# Patient Record
Sex: Male | Born: 1975
Health system: Southern US, Community
[De-identification: ages and names within clinical notes are randomized; demographics above are authoritative.]

## PROBLEM LIST (undated history)

## (undated) DIAGNOSIS — I10 Essential (primary) hypertension: Secondary | ICD-10-CM

## (undated) DIAGNOSIS — E7800 Pure hypercholesterolemia, unspecified: Secondary | ICD-10-CM

## (undated) HISTORY — PX: NO PAST SURGERIES: SHX2092

## (undated) HISTORY — PX: UPPER GASTROINTESTINAL ENDOSCOPY: SHX188

## (undated) HISTORY — DX: Essential (primary) hypertension: I10

## (undated) HISTORY — PX: COLONOSCOPY: SHX174

## (undated) HISTORY — DX: Pure hypercholesterolemia, unspecified: E78.00

---

## 2011-09-11 DIAGNOSIS — E785 Hyperlipidemia, unspecified: Secondary | ICD-10-CM | POA: Insufficient documentation

## 2011-09-11 DIAGNOSIS — J4540 Moderate persistent asthma, uncomplicated: Secondary | ICD-10-CM | POA: Insufficient documentation

## 2011-09-11 DIAGNOSIS — K219 Gastro-esophageal reflux disease without esophagitis: Secondary | ICD-10-CM | POA: Insufficient documentation

## 2011-09-11 DIAGNOSIS — G4700 Insomnia, unspecified: Secondary | ICD-10-CM | POA: Insufficient documentation

## 2011-09-16 DIAGNOSIS — A6000 Herpesviral infection of urogenital system, unspecified: Secondary | ICD-10-CM | POA: Insufficient documentation

## 2011-09-16 DIAGNOSIS — K5900 Constipation, unspecified: Secondary | ICD-10-CM | POA: Insufficient documentation

## 2011-09-16 DIAGNOSIS — G8929 Other chronic pain: Secondary | ICD-10-CM | POA: Insufficient documentation

## 2011-09-16 DIAGNOSIS — M545 Low back pain, unspecified: Secondary | ICD-10-CM | POA: Insufficient documentation

## 2011-09-16 DIAGNOSIS — L649 Androgenic alopecia, unspecified: Secondary | ICD-10-CM | POA: Insufficient documentation

## 2012-12-23 DIAGNOSIS — I10 Essential (primary) hypertension: Secondary | ICD-10-CM | POA: Insufficient documentation

## 2013-07-17 DIAGNOSIS — R059 Cough, unspecified: Secondary | ICD-10-CM | POA: Insufficient documentation

## 2013-07-17 DIAGNOSIS — Z789 Other specified health status: Secondary | ICD-10-CM | POA: Insufficient documentation

## 2013-07-17 DIAGNOSIS — R05 Cough: Secondary | ICD-10-CM | POA: Insufficient documentation

## 2013-07-25 DIAGNOSIS — J45909 Unspecified asthma, uncomplicated: Secondary | ICD-10-CM | POA: Insufficient documentation

## 2014-09-23 DIAGNOSIS — E669 Obesity, unspecified: Secondary | ICD-10-CM | POA: Insufficient documentation

## 2014-09-23 DIAGNOSIS — R600 Localized edema: Secondary | ICD-10-CM | POA: Insufficient documentation

## 2014-09-23 DIAGNOSIS — R635 Abnormal weight gain: Secondary | ICD-10-CM | POA: Insufficient documentation

## 2014-09-23 DIAGNOSIS — E6609 Other obesity due to excess calories: Secondary | ICD-10-CM | POA: Insufficient documentation

## 2014-09-28 DIAGNOSIS — R000 Tachycardia, unspecified: Secondary | ICD-10-CM | POA: Insufficient documentation

## 2014-09-28 DIAGNOSIS — R072 Precordial pain: Secondary | ICD-10-CM | POA: Insufficient documentation

## 2016-02-07 DIAGNOSIS — H6120 Impacted cerumen, unspecified ear: Secondary | ICD-10-CM

## 2016-02-07 HISTORY — DX: Impacted cerumen, unspecified ear: H61.20

## 2017-08-27 LAB — HEPATIC FUNCTION PANEL
ALT: 59 — AB (ref 10–40)
AST: 23 (ref 14–40)
Alkaline Phosphatase: 88 (ref 25–125)

## 2017-08-27 LAB — BASIC METABOLIC PANEL
BUN: 15 (ref 4–21)
Creatinine: 1 (ref <=1.3)
Glucose: 111
Potassium: 4.2 (ref 3.4–5.3)
Sodium: 132 — AB (ref 137–147)

## 2017-08-27 LAB — TSH: TSH: 0.86 (ref <=5.90)

## 2017-08-27 LAB — LIPID PANEL
Cholesterol: 157 (ref 0–200)
HDL: 38 (ref 35–70)
LDL Cholesterol: 107
Triglycerides: 120 (ref 40–160)

## 2017-08-27 LAB — CBC AND DIFFERENTIAL
HCT: 43 (ref 41–53)
Hemoglobin: 14.2 (ref 13.5–17.5)
Platelets: 269 (ref 150–399)
WBC: 13.6

## 2017-08-29 DIAGNOSIS — R7989 Other specified abnormal findings of blood chemistry: Secondary | ICD-10-CM | POA: Insufficient documentation

## 2017-08-29 DIAGNOSIS — R945 Abnormal results of liver function studies: Secondary | ICD-10-CM | POA: Insufficient documentation

## 2017-08-29 DIAGNOSIS — R7303 Prediabetes: Secondary | ICD-10-CM | POA: Insufficient documentation

## 2017-08-29 DIAGNOSIS — E291 Testicular hypofunction: Secondary | ICD-10-CM | POA: Insufficient documentation

## 2018-01-29 LAB — HEMOGLOBIN A1C: Hemoglobin A1C: 5.7

## 2018-01-29 LAB — LIPID PANEL
Cholesterol: 139 (ref 0–200)
HDL: 38 (ref 35–70)
LDL Cholesterol: 102
Triglycerides: 99 (ref 40–160)

## 2018-01-29 LAB — BASIC METABOLIC PANEL
BUN: 14 (ref 4–21)
Creatinine: 0.9 (ref <=1.3)
Glucose: 75
Potassium: 4.6 (ref 3.4–5.3)
Sodium: 135 — AB (ref 137–147)

## 2018-01-29 LAB — HEPATIC FUNCTION PANEL
ALT: 32 (ref 10–40)
AST: 20 (ref 14–40)
Alkaline Phosphatase: 72 (ref 25–125)

## 2018-01-29 LAB — CBC AND DIFFERENTIAL
HCT: 49 (ref 41–53)
Hemoglobin: 16.1 (ref 13.5–17.5)
Platelets: 335 (ref 150–399)
WBC: 12

## 2018-01-29 LAB — TSH: TSH: 1.11 (ref <=5.90)

## 2018-09-11 DIAGNOSIS — R1319 Other dysphagia: Secondary | ICD-10-CM | POA: Insufficient documentation

## 2018-09-11 DIAGNOSIS — K2100 Gastro-esophageal reflux disease with esophagitis, without bleeding: Secondary | ICD-10-CM | POA: Insufficient documentation

## 2019-07-20 ENCOUNTER — Ambulatory Visit (INDEPENDENT_AMBULATORY_CARE_PROVIDER_SITE_OTHER): Payer: No Typology Code available for payment source | Admitting: Family Medicine

## 2019-07-20 ENCOUNTER — Other Ambulatory Visit: Payer: Self-pay

## 2019-07-20 ENCOUNTER — Encounter: Payer: Self-pay | Admitting: Family Medicine

## 2019-07-20 VITALS — BP 125/90 | HR 98 | Temp 98.6°F | Ht 67.0 in | Wt 201.0 lb

## 2019-07-20 DIAGNOSIS — F419 Anxiety disorder, unspecified: Secondary | ICD-10-CM

## 2019-07-20 DIAGNOSIS — E782 Mixed hyperlipidemia: Secondary | ICD-10-CM

## 2019-07-20 DIAGNOSIS — H43393 Other vitreous opacities, bilateral: Secondary | ICD-10-CM

## 2019-07-20 DIAGNOSIS — F5101 Primary insomnia: Secondary | ICD-10-CM

## 2019-07-20 DIAGNOSIS — I10 Essential (primary) hypertension: Secondary | ICD-10-CM

## 2019-07-20 DIAGNOSIS — J4540 Moderate persistent asthma, uncomplicated: Secondary | ICD-10-CM | POA: Diagnosis not present

## 2019-07-20 DIAGNOSIS — R7303 Prediabetes: Secondary | ICD-10-CM

## 2019-07-20 DIAGNOSIS — K2100 Gastro-esophageal reflux disease with esophagitis, without bleeding: Secondary | ICD-10-CM

## 2019-07-20 DIAGNOSIS — F988 Other specified behavioral and emotional disorders with onset usually occurring in childhood and adolescence: Secondary | ICD-10-CM

## 2019-07-20 DIAGNOSIS — E291 Testicular hypofunction: Secondary | ICD-10-CM

## 2019-07-20 MED ORDER — MONTELUKAST SODIUM 10 MG PO TABS: 10.0000 mg | ORAL_TABLET | Freq: Every day | ORAL | 3 refills | Status: DC

## 2019-07-20 MED ORDER — LANSOPRAZOLE 30 MG PO CPDR: 30.0000 mg | DELAYED_RELEASE_CAPSULE | Freq: Every day | ORAL | 3 refills | Status: DC

## 2019-07-20 MED ORDER — TRAZODONE HCL 50 MG PO TABS: ORAL_TABLET | ORAL | 3 refills | Status: DC

## 2019-07-20 MED ORDER — ZOLPIDEM TARTRATE 10 MG PO TABS: ORAL_TABLET | ORAL | 3 refills | Status: DC

## 2019-07-20 NOTE — Progress Notes (Signed)
Patient: Troy Burgess MRN: [id number] DOB: 1976/08/11 PCP: Orma Flaming, MD     Subjective:  Chief Complaint  Patient presents with  . Establish Care    HPI: The patient is a 43 y.o. male who presents today for establishing care. He has multiple issues. Just had lab work done in [facility].   Hypertension: Here for follow up of hypertension.  Currently on cozaar 50mg /day .  Takes medication as prescribed and denies any side effects. Exercise includes walking. Weight has been stable. Denies any chest pain, headaches, shortness of breath, vision changes, swelling in lower extremities.   Hypogonadism: followed by urology, has had complete work up done. On 100mg  testosterone/week and doing well. Would like to see if we can take this over.   Hyperlipidemia: well controlled on medication. Thinks he just had his labs done.   Prediabetes: has lost 30 pounds. Unsure what his lab work showed recently.   ADD: [facility] at [facility]. Needs referral with new insurance.   Anxiety: on low dose lexapro. Mainly needs for public speaking in big crowds.   GERD: prevacid. Requesting prescription.   Insomnia: ambien and trazodone. Requesting refills. Has been on >10 years.   Having increased floaters in both eyes. Would like referral to eye doctor.    Flu shot: had last month Tdap: utd, need shot records. hiv screen: will get records and if need will add at next visit.   Review of Systems  Constitutional: Negative for fever.  Eyes: Positive for visual disturbance.  Respiratory: Negative for shortness of breath.   Cardiovascular: Negative for chest pain, palpitations and leg swelling.  Gastrointestinal: Negative for abdominal pain, diarrhea, nausea and vomiting.  Endocrine: Negative for polydipsia and polyuria.  Skin: Negative for rash.  Neurological: Negative for dizziness and headaches.  Psychiatric/Behavioral: Negative for sleep disturbance.     Allergies Patient is allergic to amlodipine; bupropion; ciprofloxacin; codeine; hydrocodone-acetaminophen; lisinopril; and nortriptyline.  Past Medical History Patient  has no past medical history on file.  Surgical History Patient  has no past surgical history on file.  Family History Pateint's family history is not on file.  Social History Patient  reports that he has quit smoking. He has never used smokeless tobacco.    Objective: Vitals:   07/20/19 1050  BP: 125/90  Pulse: 98  Temp: 98.6 F (37 C)  TempSrc: Skin  SpO2: 95%  Weight: 201 lb (91.2 kg)  Height: 5\' 7"  (1.702 m)    Body mass index is 31.48 kg/m.  Physical Exam Vitals signs reviewed.  Constitutional:      Appearance: Normal appearance. He is well-developed.  HENT:     Head: Normocephalic and atraumatic.     Right Ear: Tympanic membrane, ear canal and external ear normal.     Left Ear: Tympanic membrane, ear canal and external ear normal.     Nose: Nose normal.     Mouth/Throat:     Mouth: Mucous membranes are moist.  Eyes:     Extraocular Movements: Extraocular movements intact.     Conjunctiva/sclera: Conjunctivae normal.     Pupils: Pupils are equal, round, and reactive to light.  Neck:     Musculoskeletal: Normal range of motion and neck supple.     Thyroid: No thyromegaly.     Vascular: No carotid bruit.  Cardiovascular:     Rate and Rhythm: Normal rate and regular rhythm.     Heart sounds: Normal heart sounds. No murmur.  Pulmonary:  Effort: Pulmonary effort is normal.     Breath sounds: Normal breath sounds.  Abdominal:     General: Bowel sounds are normal. There is no distension.     Palpations: Abdomen is soft.     Tenderness: There is no abdominal tenderness.  Lymphadenopathy:     Cervical: No cervical adenopathy.  Skin:    General: Skin is warm and dry.     Findings: No rash.  Neurological:     General: No focal deficit present.     Mental Status: He is alert and  oriented to person, place, and time.     Cranial Nerves: No cranial nerve deficit.     Coordination: Coordination normal.     Deep Tendon Reflexes: Reflexes normal.  Psychiatric:        Behavior: Behavior normal.        Depression screen PHQ 2/9 07/20/2019  Decreased Interest 0  Down, Depressed, Hopeless 0  PHQ - 2 Score 0  Altered sleeping 0  Tired, decreased energy 0  Change in appetite 0  Feeling bad or failure about yourself  0  Trouble concentrating 0  Moving slowly or fidgety/restless 0  Suicidal thoughts 0  PHQ-9 Score 0  Difficult doing work/chores Not difficult at all     Assessment/plan: 1. Hypertension, essential Close to goal. Will not adjust at this time. Requesting records for labs/past hx. No refills needed at this time.   2. Mixed hyperlipidemia Requesting records. No refills needed.   3. Pre-diabetes Has lost 30 pounds. Requesting records before I do any labs. On no medication.   4. Moderate persistent asthma without complication singulair refilled.   5. Hypogonadism, male Will take over filling testosterone for him. No refills needed at this time. Routine labs will be needed for monitoring. Requesting records.   6. Gastroesophageal reflux disease with esophagitis without hemorrhage Sent in prevacid.   7. Primary insomnia Has been on ambien x 10 years and trazodone. Both need refilled. pmp reviewed. Will need drug contract at next visit.   8. Attention deficit disorder (ADD) without hyperactivity Already being seen by Dr. Rachel Moulds. Referral done for insurance purposes.  - Ambulatory referral to Internal Medicine  9. Floaters in visual field, bilateral  - Ambulatory referral to Ophthalmology  10. Anxiety Well controlled on 5mg  of lexapro. No refills needed at this time.     Return in about 6 months (around 01/18/2020) for fasting labs. get your records..  >40 minutes spent in face to face time. Requesting records to review. Will bring  him back earlier if needed for labs.   Orma Flaming, MD [place]    07/20/2019

## 2019-09-23 ENCOUNTER — Telehealth: Payer: Self-pay | Admitting: Family Medicine

## 2019-09-23 NOTE — Telephone Encounter (Signed)
I emailed charge corrections on 08/12/2019 to resubmit the claim for: Date of Service:07/20/19 Insurance Name: [employer] [id number]   Billing stated on 08/12/2019 that this was in a charge review due to front office adding the coverage. Billing resubmitted it out of the charge review. No further action required. No need to route note, for documentation purposes only.

## 2019-10-17 ENCOUNTER — Other Ambulatory Visit: Payer: Self-pay

## 2019-10-17 DIAGNOSIS — Z20822 Contact with and (suspected) exposure to covid-19: Secondary | ICD-10-CM

## 2019-10-18 LAB — NOVEL CORONAVIRUS, NAA: SARS-CoV-2, NAA: DETECTED — AB

## 2019-10-20 ENCOUNTER — Telehealth: Payer: Self-pay

## 2019-10-20 NOTE — Telephone Encounter (Signed)
Patient test positive for covid on Sunday. Patient would like to know if he qualify for covid infusion and would like some additional infro regarding that. Best contact number [phone].

## 2019-10-20 NOTE — Telephone Encounter (Signed)
Please see message. °

## 2019-10-21 ENCOUNTER — Other Ambulatory Visit: Payer: Self-pay | Admitting: Family Medicine

## 2019-10-21 ENCOUNTER — Telehealth: Payer: Self-pay | Admitting: Family Medicine

## 2019-10-21 MED ORDER — ALBUTEROL SULFATE HFA 108 (90 BASE) MCG/ACT IN AERS: 2.0000 | INHALATION_SPRAY | Freq: Four times a day (QID) | RESPIRATORY_TRACT | 3 refills | Status: DC | PRN

## 2019-10-21 MED ORDER — FLUTICASONE PROPIONATE HFA 220 MCG/ACT IN AERO: 1.0000 | INHALATION_SPRAY | Freq: Two times a day (BID) | RESPIRATORY_TRACT | 1 refills | Status: DC

## 2019-10-21 NOTE — Telephone Encounter (Signed)
  LAST APPOINTMENT DATE: @@LASTENCT @  NEXT APPOINTMENT DATE:@Visit  date not found  MEDICATION: albuterol (VENTOLIN HFA) 108 (90 Base) MCG/ACT inhaler/escitalopram (LEXAPRO) 10 MG tablet/fluticasone (FLOVENT HFA) 220 MCG/ACT inhaler  PHARMACY: [facility] at [place], [place] [address].  COMMENTS: Doren Custard asked for authorization because they have never refilled this for the patient   **Let patient know to contact pharmacy at the end of the day to make sure medication is ready. **  ** Please notify patient to allow 48-72 hours to process**  **Encourage patient to contact the pharmacy for refills or they can request refills through [facility]**  CLINICAL FILLS OUT ALL BELOW:   LAST REFILL:  QTY:  REFILL DATE:    OTHER COMMENTS:    Okay for refill?  Please advise

## 2019-10-21 NOTE — Telephone Encounter (Signed)
Left message to return call to our office.  

## 2019-10-21 NOTE — Telephone Encounter (Signed)
Refilled.  Mariah Gerstenberger, MD [place] [facility] [place] [facility]   

## 2019-10-21 NOTE — Telephone Encounter (Signed)
Patient called back to speak to [facility].

## 2019-10-21 NOTE — Telephone Encounter (Signed)
Let him know that he may not meet criteria. He's young and has asthma. Im sending to cheryl wicker to see if he would quality and they should get into touch with him.  Cheryl-let me know if I need to do anything different.  Thanks so much,  Dr Rogers Blocker

## 2019-10-22 NOTE — Telephone Encounter (Signed)
Left message to return call to our office.  

## 2019-10-22 NOTE — Telephone Encounter (Signed)
Dr. Rogers Blocker, pt is needing refill on Lexapro 10 mg, you have not filled this for him. Okay to fill?

## 2019-10-22 NOTE — Telephone Encounter (Signed)
Called patient gave information. He states that any time in the past he has been sick they would give neb treatment. He wanted to know if you would give script for nebulizer and medications so that he can do at home if he needs.

## 2019-10-22 NOTE — Telephone Encounter (Signed)
For covid, not recommending nebulizer treatment due to aerolized spread. I know in own home, but inhaler is recommended. I just filled this.  Orma Flaming, MD [place]

## 2019-10-22 NOTE — Telephone Encounter (Signed)
Spoke to pt told him per Dr. Rogers Blocker, due to [place], not recommending nebulizer treatment due to aerolized spread. I know in own home, but inhaler is recommended. Pt verbalized understanding and asked if both were filled and Lexapro. Told pt both inhalers were filled. I will send message about Lexapro to have filled since she has not prescribed before. Pt verbalized understanding.

## 2019-10-23 ENCOUNTER — Other Ambulatory Visit: Payer: Self-pay | Admitting: Family Medicine

## 2019-10-23 MED ORDER — ESCITALOPRAM OXALATE 10 MG PO TABS: 10.0000 mg | ORAL_TABLET | Freq: Every day | ORAL | 0 refills | Status: DC

## 2019-10-23 NOTE — Telephone Encounter (Signed)
Escribed

## 2019-10-23 NOTE — Addendum Note (Signed)
Addended by: Jerrel Ivory D on: 10/23/2019 02:16 PM   Modules accepted: Orders

## 2019-10-23 NOTE — Telephone Encounter (Signed)
MEDICATION:escitalopram (LEXAPRO) 10 MG tablet  PHARMACY: [facility] at [place], [place] Phone:  [phone]  Fax:  [phone]       Comments:   **Let patient know to contact pharmacy at the end of the day to make sure medication is ready. **  ** Please notify patient to allow 48-72 hours to process**  **Encourage patient to contact the pharmacy for refills or they can request refills through [facility]**

## 2019-10-25 NOTE — Telephone Encounter (Signed)
Okay to fill. Already filled by CMA.  Orma Flaming, MD [place]

## 2019-11-04 ENCOUNTER — Ambulatory Visit (INDEPENDENT_AMBULATORY_CARE_PROVIDER_SITE_OTHER): Payer: No Typology Code available for payment source | Admitting: Family Medicine

## 2019-11-04 ENCOUNTER — Encounter: Payer: Self-pay | Admitting: Family Medicine

## 2019-11-04 VITALS — Ht 67.0 in | Wt 205.0 lb

## 2019-11-04 DIAGNOSIS — E291 Testicular hypofunction: Secondary | ICD-10-CM | POA: Diagnosis not present

## 2019-11-04 DIAGNOSIS — J45901 Unspecified asthma with (acute) exacerbation: Secondary | ICD-10-CM | POA: Diagnosis not present

## 2019-11-04 DIAGNOSIS — U071 COVID-19: Secondary | ICD-10-CM | POA: Diagnosis not present

## 2019-11-04 MED ORDER — PREDNISONE 20 MG PO TABS: 40.0000 mg | ORAL_TABLET | Freq: Every day | ORAL | 0 refills | Status: DC

## 2019-11-04 MED ORDER — FLUTICASONE PROPIONATE 50 MCG/ACT NA SUSP: 2.0000 | Freq: Every day | NASAL | 6 refills | Status: DC

## 2019-11-04 NOTE — Progress Notes (Signed)
Patient: Troy Burgess MRN: [id number] DOB: Jul 19, 1976 PCP: Orma Flaming, MD     I connected with Troy Burgess on 11/04/19 at 4:22pm by a video enabled telemedicine application and verified that I am speaking with the correct person using two identifiers.  Location patient: Home Location provider: [facility], Office Persons participating in this virtual visit: Troy Burgess and Dr. Rogers Blocker  I discussed the limitations of evaluation and management by telemedicine and the availability of in person appointments. The patient expressed understanding and agreed to proceed.   Subjective:  Chief Complaint  Patient presents with  . Medication Problem    Testosterone; He would like you to take over medication maintenance due to insurance.   . Cough    Post Covid  . Sore Throat    With phlem new after Covid    HPI: The patient is a 44 y.o. male who presents today for post covid symptoms. He tested positive on 10/17/2019 and continues to have a cough and sore throat. He has not had any fever, chills or shortness of breath. He is now starting to cough up phelgm and wonders if he has something new. He has not taken any medication over the counter. He is not gurgling with warm salt water or using humidifier. He has some mild asthma and has done well with his flovent. He has only used his rescue inhaler once. He has some tightness in his upper lungs, but feels better after using his inhaler. His cough is mainly dry, but over the past 3 days started to have some production.   He also would like me to take over his testosterone injections. His clinic that he was going to is not really seeing patients. He also gives blood every 60 days. He has not had his labs checked in a very long time. He also had a scan of his pituitary gland and that was normal.  Currently on testosterone 200mg  q 14 days. His wife gives him his injections.   Review of Systems  Constitutional: Negative for chills, fatigue and  fever.  HENT: Positive for sore throat. Negative for congestion, dental problem, ear pain, hearing loss and trouble swallowing.   Eyes: Negative for visual disturbance.  Respiratory: Positive for cough, chest tightness and shortness of breath. Negative for wheezing.   Cardiovascular: Negative for chest pain, palpitations and leg swelling.  Gastrointestinal: Negative for abdominal pain, blood in stool, diarrhea and nausea.  Endocrine: Negative for cold intolerance, polydipsia, polyphagia and polyuria.  Genitourinary: Negative for dysuria and hematuria.  Musculoskeletal: Negative for arthralgias.  Skin: Negative for rash.  Neurological: Negative for dizziness and headaches.  Psychiatric/Behavioral: Negative for dysphoric mood and sleep disturbance. The patient is not nervous/anxious.     Allergies Patient is allergic to amlodipine; bupropion; ciprofloxacin; codeine; hydrocodone-acetaminophen; lisinopril; and nortriptyline.  Past Medical History Patient  has no past medical history on file.  Surgical History Patient  has no past surgical history on file.  Family History Pateint's family history is not on file.  Social History Patient  reports that he has quit smoking. He has never used smokeless tobacco.    Objective: Vitals:   11/04/19 1355  Weight: 205 lb (93 kg)  Height: 5\' 7"  (1.702 m)    Body mass index is 32.11 kg/m.  Physical Exam Vitals reviewed.  Constitutional:      Appearance: He is well-developed.  HENT:     Head: Normocephalic and atraumatic.  Pulmonary:     Effort: Pulmonary effort is normal.  Neurological:     General: No focal deficit present.     Mental Status: He is alert and oriented to person, place, and time.  Psychiatric:        Mood and Affect: Mood normal.        Behavior: Behavior normal.        Assessment/plan: 1. Clinical diagnosis of COVID-19 Continues to have lingering cough. ? If asthma contributing vs. Bronchitis. Conservative  therapy at this time as no clinical suspicion for bacterial source off history and exam via video. Recommended otc cough medication, cool mist humidifier, warm salt water gurgles, honey and use of his albuterol inhaler scheduled for 2-3 days. If not getting better can come in for xray. Precautions given.   2. Mild asthma exacerbation Steroid burst x 5 days if needed and scheduled albuterol x 2-3 days.   3. Hypogonadism, male  - Ambulatory referral to Urology    Return if symptoms worsen or fail to improve.  Records requested if needed. Time spent with patient: 20 minutes, of which >50% was spent in obtaining information about his symptoms, reviweing his previous labs, evaluations, and treatments, counseling him about his conditions (please see discussed topics above), and developing a plan to further investigate it; he had a number of questions which I addressed.    Orma Flaming, MD [place]  11/04/2019

## 2019-11-04 NOTE — Progress Notes (Deleted)
Patient: Troy Burgess MRN: [id number] DOB: 1975-12-17 PCP: Orma Flaming, MD     I connected with Briscoe Deutscher on 11/04/19 at @CHLAPPTIME @ by a video enabled telemedicine application and verified that I am speaking with the correct person using two identifiers.  Location patient: Home Location provider: [facility], Office Persons participating in this virtual visit: ***  I discussed the limitations of evaluation and management by telemedicine and the availability of in person appointments. The patient expressed understanding and agreed to proceed.   Subjective:  No chief complaint on file.   HPI: The patient is a 44 y.o. male who presents today for ***  Review of Systems  Allergies Patient is allergic to amlodipine; bupropion; ciprofloxacin; codeine; hydrocodone-acetaminophen; lisinopril; and nortriptyline.  Past Medical History Patient  has no past medical history on file.  Surgical History Patient  has no past surgical history on file.  Family History Pateint's family history is not on file.  Social History Patient  reports that he has quit smoking. He has never used smokeless tobacco.    Objective: There were no vitals filed for this visit.  There is no height or weight on file to calculate BMI.  Physical Exam     Assessment/plan:      No follow-ups on file.  Records requested if needed. Time spent with patient: *** minutes, of which >50% was spent in obtaining information about his symptoms, reviweing his previous labs, evaluations, and treatments, counseling him about his conditions (please see discussed topics above), and developing a plan to further investigate it; he had a number of questions which I addressed.    Orma Flaming, MD [place]  11/04/2019

## 2019-12-24 ENCOUNTER — Other Ambulatory Visit: Payer: Self-pay | Admitting: Family Medicine

## 2020-01-08 ENCOUNTER — Other Ambulatory Visit: Payer: Self-pay | Admitting: Family Medicine

## 2020-01-08 ENCOUNTER — Telehealth: Payer: Self-pay | Admitting: Family Medicine

## 2020-01-08 MED ORDER — LOSARTAN POTASSIUM 50 MG PO TABS: 50.0000 mg | ORAL_TABLET | Freq: Every day | ORAL | 1 refills | Status: DC

## 2020-01-08 MED ORDER — ATORVASTATIN CALCIUM 40 MG PO TABS: 40.0000 mg | ORAL_TABLET | Freq: Every day | ORAL | 3 refills | Status: DC

## 2020-01-08 NOTE — Telephone Encounter (Signed)
  LAST APPOINTMENT DATE: 11/04/19  NEXT APPOINTMENT DATE:@Visit  date not found  MEDICATION:atorvastatin (LIPITOR) 40 MG tablet/VALSARTAN 160 MG  PHARMACY:[facility] at [place], [place] [address]  **Let patient know to contact pharmacy at the end of the day to make sure medication is ready. **  ** Please notify patient to allow 48-72 hours to process**  **Encourage patient to contact the pharmacy for refills or they can request refills through [facility]**  CLINICAL FILLS OUT ALL BELOW:   LAST REFILL:  QTY:  REFILL DATE:    OTHER COMMENTS:    Okay for refill?  Please advise

## 2020-01-08 NOTE — Telephone Encounter (Signed)
Refilled.  Shayley Medlin, MD [place]   

## 2020-02-09 ENCOUNTER — Other Ambulatory Visit: Payer: Self-pay | Admitting: Family Medicine

## 2020-02-09 NOTE — Telephone Encounter (Signed)
LAST APPOINTMENT DATE: 11/04/2019  NEXT APPOINTMENT DATE: Visit date not found  RX Ambien   LAST REFILL: 07/19/2019 QTY: 90 3RF

## 2020-03-25 ENCOUNTER — Other Ambulatory Visit: Payer: Self-pay | Admitting: Family Medicine

## 2020-03-25 MED ORDER — DOXYCYCLINE HYCLATE 100 MG PO TABS: ORAL_TABLET | ORAL | 0 refills | Status: DC

## 2020-05-18 ENCOUNTER — Other Ambulatory Visit: Payer: Self-pay | Admitting: Family Medicine

## 2020-06-22 ENCOUNTER — Other Ambulatory Visit: Payer: Self-pay | Admitting: Family Medicine

## 2020-07-12 ENCOUNTER — Other Ambulatory Visit (HOSPITAL_COMMUNITY): Payer: Self-pay | Admitting: Urology

## 2020-07-21 ENCOUNTER — Other Ambulatory Visit (HOSPITAL_COMMUNITY): Payer: Self-pay | Admitting: Internal Medicine

## 2020-07-21 ENCOUNTER — Other Ambulatory Visit: Payer: Self-pay | Admitting: Family Medicine

## 2020-07-22 ENCOUNTER — Encounter: Payer: Self-pay | Admitting: Family Medicine

## 2020-07-22 ENCOUNTER — Ambulatory Visit (INDEPENDENT_AMBULATORY_CARE_PROVIDER_SITE_OTHER): Payer: No Typology Code available for payment source | Admitting: Family Medicine

## 2020-07-22 ENCOUNTER — Other Ambulatory Visit: Payer: Self-pay | Admitting: Family Medicine

## 2020-07-22 ENCOUNTER — Other Ambulatory Visit: Payer: Self-pay

## 2020-07-22 VITALS — BP 128/90 | HR 97 | Temp 97.6°F | Ht 67.0 in | Wt 208.0 lb

## 2020-07-22 DIAGNOSIS — I10 Essential (primary) hypertension: Secondary | ICD-10-CM | POA: Diagnosis not present

## 2020-07-22 DIAGNOSIS — R7303 Prediabetes: Secondary | ICD-10-CM | POA: Diagnosis not present

## 2020-07-22 DIAGNOSIS — Z1159 Encounter for screening for other viral diseases: Secondary | ICD-10-CM

## 2020-07-22 DIAGNOSIS — E782 Mixed hyperlipidemia: Secondary | ICD-10-CM

## 2020-07-22 DIAGNOSIS — F419 Anxiety disorder, unspecified: Secondary | ICD-10-CM

## 2020-07-22 DIAGNOSIS — Z0000 Encounter for general adult medical examination without abnormal findings: Secondary | ICD-10-CM

## 2020-07-22 DIAGNOSIS — E291 Testicular hypofunction: Secondary | ICD-10-CM

## 2020-07-22 DIAGNOSIS — Z114 Encounter for screening for human immunodeficiency virus [HIV]: Secondary | ICD-10-CM

## 2020-07-22 DIAGNOSIS — Z1211 Encounter for screening for malignant neoplasm of colon: Secondary | ICD-10-CM

## 2020-07-22 NOTE — Patient Instructions (Signed)
-find out when you had your tdap (tetanus) -flu shot whenever you can do it.  -referral for cscope (colon cancer screening) -all of your labs today including testosterone (best time to check is in the AM) and prostate  Blood pressure high, you haven't been on meds though.  Really work on exercise/weight loss.   So good to see you! Dr. Rogers Blocker   Preventive Care 61-46 Years Old, Male Preventive care refers to lifestyle choices and visits with your health care provider that can promote health and wellness. This includes:  A yearly physical exam. This is also called an annual well check.  Regular dental and eye exams.  Immunizations.  Screening for certain conditions.  Healthy lifestyle choices, such as eating a healthy diet, getting regular exercise, not using drugs or products that contain nicotine and tobacco, and limiting alcohol use. What can I expect for my preventive care visit? Physical exam Your health care provider will check:  Height and weight. These may be used to calculate body mass index (BMI), which is a measurement that tells if you are at a healthy weight.  Heart rate and blood pressure.  Your skin for abnormal spots. Counseling Your health care provider may ask you questions about:  Alcohol, tobacco, and drug use.  Emotional well-being.  Home and relationship well-being.  Sexual activity.  Eating habits.  Work and work Statistician. What immunizations do I need?  Influenza (flu) vaccine  This is recommended every year. Tetanus, diphtheria, and pertussis (Tdap) vaccine  You may need a Td booster every 10 years. Varicella (chickenpox) vaccine  You may need this vaccine if you have not already been vaccinated. Zoster (shingles) vaccine  You may need this after age 40. Measles, mumps, and rubella (MMR) vaccine  You may need at least one dose of MMR if you were born in 1957 or later. You may also need a second dose. Pneumococcal conjugate (PCV13)  vaccine  You may need this if you have certain conditions and were not previously vaccinated. Pneumococcal polysaccharide (PPSV23) vaccine  You may need one or two doses if you smoke cigarettes or if you have certain conditions. Meningococcal conjugate (MenACWY) vaccine  You may need this if you have certain conditions. Hepatitis A vaccine  You may need this if you have certain conditions or if you travel or work in places where you may be exposed to hepatitis A. Hepatitis B vaccine  You may need this if you have certain conditions or if you travel or work in places where you may be exposed to hepatitis B. Haemophilus influenzae type b (Hib) vaccine  You may need this if you have certain risk factors. Human papillomavirus (HPV) vaccine  If recommended by your health care provider, you may need three doses over 6 months. You may receive vaccines as individual doses or as more than one vaccine together in one shot (combination vaccines). Talk with your health care provider about the risks and benefits of combination vaccines. What tests do I need? Blood tests  Lipid and cholesterol levels. These may be checked every 5 years, or more frequently if you are over 78 years old.  Hepatitis C test.  Hepatitis B test. Screening  Lung cancer screening. You may have this screening every year starting at age 79 if you have a 30-pack-year history of smoking and currently smoke or have quit within the past 15 years.  Prostate cancer screening. Recommendations will vary depending on your family history and other risks.  Colorectal cancer  screening. All adults should have this screening starting at age 50 and continuing until age 75. Your health care provider may recommend screening at age 45 if you are at increased risk. You will have tests every 1-10 years, depending on your results and the type of screening test.  Diabetes screening. This is done by checking your blood sugar (glucose) after  you have not eaten for a while (fasting). You may have this done every 1-3 years.  Sexually transmitted disease (STD) testing. Follow these instructions at home: Eating and drinking  Eat a diet that includes fresh fruits and vegetables, whole grains, lean protein, and low-fat dairy products.  Take vitamin and mineral supplements as recommended by your health care provider.  Do not drink alcohol if your health care provider tells you not to drink.  If you drink alcohol: ? Limit how much you have to 0-2 drinks a day. ? Be aware of how much alcohol is in your drink. In the [place] one drink equals one 12 oz bottle of beer (355 mL), one 5 oz glass of wine (148 mL), or one 1 oz glass of hard liquor (44 mL). Lifestyle  Take daily care of your teeth and gums.  Stay active. Exercise for at least 30 minutes on 5 or more days each week.  Do not use any products that contain nicotine or tobacco, such as cigarettes, e-cigarettes, and chewing tobacco. If you need help quitting, ask your health care provider.  If you are sexually active, practice safe sex. Use a condom or other form of protection to prevent STIs (sexually transmitted infections).  Talk with your health care provider about taking a low-dose aspirin every day starting at age 50. What's next?  Go to your health care provider once a year for a well check visit.  Ask your health care provider how often you should have your eyes and teeth checked.  Stay up to date on all vaccines. This information is not intended to replace advice given to you by your health care provider. Make sure you discuss any questions you have with your health care provider. Document Revised: 09/11/2018 Document Reviewed: 09/11/2018 Elsevier Patient Education  2020 [employer] .      

## 2020-07-22 NOTE — Progress Notes (Signed)
Patient: Troy Burgess MRN: [id number] DOB: 02-Jan-1976 PCP: Orma Flaming, MD     Subjective:  Chief Complaint  Patient presents with   Annual Exam   Hypertension   Hyperlipidemia   Prediabetes   Anxiety    HPI: The patient is a 44 y.o. male who presents today for annual exam and follow up of chronic conditions. He denies any changes to past medical history. There have been no recent hospitalizations. They are not following a well balanced diet and exercise plan. Weight has been stable.   Hypertension: Here for follow up of hypertension.  Currently on cozaar 50mg /day. Takes medication as prescribed and denies any side effects. Has not taken his medication in 4 days.  Exercise includes none.  Weight has been stable. Denies any chest pain, headaches, shortness of breath, vision changes, swelling in lower extremities.   Prediabetes Last a1c was 5.7 and this was 2 years ago. He has not been exercising or eating right. He does drink wine. Due for labs.   Hyperlipidemia Currently on lipitor 40mg /day. He thinks he takes this daily.   Anxiety Currently on lexapro 10mg /day. Has had to increase this to 10mg  due to some home situations. In counseling. Marriage is difficult right now.  Immunization History  Administered Date(s) Administered   Influenza-Unspecified 07/23/2017, 06/23/2018   Janssen (J&J) SARS-COV-2 Vaccination 04/11/2020   Colonoscopy: 7-8 years. Done for constipation.  PSA: today   Has had his covid vaccine.  Is traveling and does not want flu shot.   Review of Systems  Constitutional: Negative for chills, fatigue and fever.  HENT: Negative for dental problem, ear pain, hearing loss and trouble swallowing.   Eyes: Negative for visual disturbance.  Respiratory: Negative for cough, chest tightness and shortness of breath.   Cardiovascular: Negative for chest pain, palpitations and leg swelling.  Gastrointestinal: Negative for abdominal pain, blood in stool,  diarrhea and nausea.  Endocrine: Negative for cold intolerance, polydipsia, polyphagia and polyuria.  Genitourinary: Negative for dysuria and hematuria.  Musculoskeletal: Negative for arthralgias.  Skin: Negative for rash.  Neurological: Negative for dizziness and headaches.  Psychiatric/Behavioral: Negative for dysphoric mood and sleep disturbance. The patient is not nervous/anxious.     Allergies Patient is allergic to amlodipine, bupropion, ciprofloxacin, codeine, hydrocodone-acetaminophen, lisinopril, and nortriptyline.  Past Medical History Patient  has no past medical history on file.  Surgical History Patient  has no past surgical history on file.  Family History Pateint's family history is not on file.  Social History Patient  reports that he has quit smoking. He has never used smokeless tobacco. He reports current alcohol use of about 20.0 standard drinks of alcohol per week. He reports that he does not use drugs.    Objective: Vitals:   07/22/20 1336 07/22/20 1417  BP: (!) 138/96 128/90  Pulse: 97   Temp: 97.6 F (36.4 C)   SpO2: 98%   Weight: 208 lb (94.3 kg)   Height: 5\' 7"  (1.702 m)     Body mass index is 32.58 kg/m.  Physical Exam Vitals reviewed.  Constitutional:      Appearance: Normal appearance. He is well-developed. He is obese.  HENT:     Head: Normocephalic and atraumatic.     Right Ear: Tympanic membrane, ear canal and external ear normal.     Left Ear: Tympanic membrane, ear canal and external ear normal.     Mouth/Throat:     Mouth: Mucous membranes are moist.  Eyes:     Extraocular Movements:  Extraocular movements intact.     Conjunctiva/sclera: Conjunctivae normal.     Pupils: Pupils are equal, round, and reactive to light.  Neck:     Thyroid: No thyromegaly.  Cardiovascular:     Rate and Rhythm: Normal rate and regular rhythm.     Pulses: Normal pulses.     Heart sounds: Normal heart sounds. No murmur heard.   Pulmonary:      Effort: Pulmonary effort is normal.     Breath sounds: Normal breath sounds.  Abdominal:     General: Bowel sounds are normal. There is no distension.     Palpations: Abdomen is soft.     Tenderness: There is no abdominal tenderness.  Musculoskeletal:     Cervical back: Normal range of motion and neck supple.  Lymphadenopathy:     Cervical: No cervical adenopathy.  Skin:    General: Skin is warm and dry.     Capillary Refill: Capillary refill takes less than 2 seconds.     Findings: No rash.  Neurological:     General: No focal deficit present.     Mental Status: He is alert and oriented to person, place, and time.     Cranial Nerves: No cranial nerve deficit.     Coordination: Coordination normal.     Deep Tendon Reflexes: Reflexes normal.  Psychiatric:        Mood and Affect: Mood normal.        Behavior: Behavior normal.        Office Visit from 07/22/2020 in [place]  PHQ-2 Total Score 0       Assessment/plan: 1. Annual physical exam Hm reviewed. He is not fasting today. Has had tdap, but doesn't remember date. declines flu shot as he is traveling. Really encouraged exercise and weight loss and he knows he needs to do this. Does have gym membership and plans to start this soon. Otherwise doing well.  Patient counseling [x]    Nutrition: Stressed importance of moderation in sodium/caffeine intake, saturated fat and cholesterol, caloric balance, sufficient intake of fresh fruits, vegetables, fiber, calcium, iron, and 1 mg of folate supplement per day (for females capable of pregnancy).  [x]    Stressed the importance of regular exercise.   []    Substance Abuse: Discussed cessation/primary prevention of tobacco, alcohol, or other drug use; driving or other dangerous activities under the influence; availability of treatment for abuse.   [x]    Injury prevention: Discussed safety belts, safety helmets, smoke detector, smoking near bedding or upholstery.    [x]    Sexuality: Discussed sexually transmitted diseases, partner selection, use of condoms, avoidance of unintended pregnancy  and contraceptive alternatives.  [x]    Dental health: Discussed importance of regular tooth brushing, flossing, and dental visits.  [x]    Health maintenance and immunizations reviewed. Please refer to Health maintenance section.    - TSH; Future - TSH  2. Hypertension, essential Slightly above goal. Has not taken medication in the last 4 days. Really needs to get back on this and work on weight loss. Routine lab work today. Refills given. F/u in 6 months.  - CBC with Differential/Platelet; Future - Microalbumin / creatinine urine ratio; Future - COMPLETE METABOLIC PANEL WITH GFR; Future - COMPLETE METABOLIC PANEL WITH GFR - CBC with Differential/Platelet - Microalbumin / creatinine urine ratio  3. Hypogonadism, male Off high dose steroids that previous physician had him on. Saw urology who had him stop cold [place]. Will forward labs to Dr. Tresa Moore.  -  Testosterone; Future - Estradiol; Future - PSA; Future - PSA - Estradiol - Testosterone  4. Mixed hyperlipidemia On statin, checking labs. Continue meds.  - Lipid panel; Future - Lipid panel  5. Pre-diabetes Has been to goal, but has not had labs in some time. Checking today. utd on covid vaccine. Encouraged weight loss and exercise.  - Hemoglobin A1c; Future - Hemoglobin A1c  6. Anxiety Currently on 10mg  of lexapro and counseling. Well controlled. States it has been slightly worse due to marriage issues, but he feels like he is doing well on current medication. F/u in 6 months.   7. Encounter for hepatitis C screening test for low risk patient  - Hepatitis C antibody; Future - Hepatitis C antibody  8. Encounter for screening for HIV  - HIV Antibody (routine testing w rflx); Future - HIV Antibody (routine testing w rflx)  9. Encounter for screening colonoscopy  - Ambulatory referral to  Gastroenterology    This visit occurred during the SARS-CoV-2 public health emergency.  Safety protocols were in place, including screening questions prior to the visit, additional usage of staff PPE, and extensive cleaning of exam room while observing appropriate contact time as indicated for disinfecting solutions.     Return in about 6 months (around 01/20/2021) for htn/prediabetes/anxiety .    Orma Flaming, MD [place]  07/22/2020

## 2020-07-23 LAB — MICROALBUMIN / CREATININE URINE RATIO
Creatinine, Urine: 88 mg/dL (ref 20–320)
Microalb Creat Ratio: 6 mcg/mg creat (ref <30)
Microalb, Ur: 0.5 mg/dL

## 2020-07-25 LAB — CBC WITH DIFFERENTIAL/PLATELET
Absolute Monocytes: 928 {cells}/uL (ref 200–950)
Basophils Absolute: 82 {cells}/uL (ref 0–200)
Basophils Relative: 0.9 %
Eosinophils Absolute: 100 {cells}/uL (ref 15–500)
Eosinophils Relative: 1.1 %
HCT: 42.6 % (ref 38.5–50.0)
Hemoglobin: 13.2 g/dL (ref 13.2–17.1)
Lymphs Abs: 1738 {cells}/uL (ref 850–3900)
MCH: 23.1 pg — ABNORMAL LOW (ref 27.0–33.0)
MCHC: 31 g/dL — ABNORMAL LOW (ref 32.0–36.0)
MCV: 74.6 fL — ABNORMAL LOW (ref 80.0–100.0)
MPV: 10.4 fL (ref 7.5–12.5)
Monocytes Relative: 10.2 %
Neutro Abs: 6252 {cells}/uL (ref 1500–7800)
Neutrophils Relative %: 68.7 %
Platelets: 419 Thousand/uL — ABNORMAL HIGH (ref 140–400)
RBC: 5.71 Million/uL (ref 4.20–5.80)
RDW: 18.6 % — ABNORMAL HIGH (ref 11.0–15.0)
Total Lymphocyte: 19.1 %
WBC: 9.1 Thousand/uL (ref 3.8–10.8)

## 2020-07-25 LAB — COMPLETE METABOLIC PANEL WITHOUT GFR
AG Ratio: 1.6 (calc) (ref 1.0–2.5)
ALT: 44 U/L (ref 9–46)
AST: 24 U/L (ref 10–40)
Albumin: 4.5 g/dL (ref 3.6–5.1)
Alkaline phosphatase (APISO): 120 U/L (ref 36–130)
BUN/Creatinine Ratio: 31 (calc) — ABNORMAL HIGH (ref 6–22)
BUN: 26 mg/dL — ABNORMAL HIGH (ref 7–25)
CO2: 27 mmol/L (ref 20–32)
Calcium: 10.6 mg/dL — ABNORMAL HIGH (ref 8.6–10.3)
Chloride: 101 mmol/L (ref 98–110)
Creat: 0.85 mg/dL (ref 0.60–1.35)
GFR, Est African American: 121 mL/min/1.73m2 (ref >=60)
GFR, Est Non African American: 104 mL/min/1.73m2 (ref >=60)
Globulin: 2.8 g/dL (ref 1.9–3.7)
Glucose, Bld: 98 mg/dL (ref 65–99)
Potassium: 5.3 mmol/L (ref 3.5–5.3)
Sodium: 137 mmol/L (ref 135–146)
Total Bilirubin: 0.5 mg/dL (ref 0.2–1.2)
Total Protein: 7.3 g/dL (ref 6.1–8.1)

## 2020-07-25 LAB — TESTOSTERONE: Testosterone: 176 ng/dL — ABNORMAL LOW (ref 250–827)

## 2020-07-25 LAB — TSH: TSH: 1.01 mIU/L (ref 0.40–4.50)

## 2020-07-25 LAB — LIPID PANEL
Cholesterol: 179 mg/dL (ref <200)
HDL: 50 mg/dL (ref >=40)
LDL Cholesterol (Calc): 104 mg/dL (calc) — ABNORMAL HIGH
Non-HDL Cholesterol (Calc): 129 mg/dL (calc) (ref <130)
Total CHOL/HDL Ratio: 3.6 (calc) (ref <5.0)
Triglycerides: 158 mg/dL — ABNORMAL HIGH (ref <150)

## 2020-07-25 LAB — ESTRADIOL: Estradiol: 25 pg/mL (ref <=39)

## 2020-07-25 LAB — HEMOGLOBIN A1C
Hgb A1c MFr Bld: 5.8 % of total Hgb — ABNORMAL HIGH (ref <5.7)
Mean Plasma Glucose: 120 (calc)
eAG (mmol/L): 6.6 (calc)

## 2020-07-25 LAB — HEPATITIS C ANTIBODY
Hepatitis C Ab: NONREACTIVE
SIGNAL TO CUT-OFF: 0.01 (ref <1.00)

## 2020-07-25 LAB — HIV ANTIBODY (ROUTINE TESTING W REFLEX): HIV 1&2 Ab, 4th Generation: NONREACTIVE

## 2020-07-25 LAB — PSA: PSA: 0.76 ng/mL (ref <=4.0)

## 2020-07-26 ENCOUNTER — Other Ambulatory Visit: Payer: Self-pay | Admitting: Family Medicine

## 2020-07-26 DIAGNOSIS — D7589 Other specified diseases of blood and blood-forming organs: Secondary | ICD-10-CM

## 2020-07-28 ENCOUNTER — Other Ambulatory Visit: Payer: Self-pay | Admitting: Family Medicine

## 2020-08-03 ENCOUNTER — Other Ambulatory Visit: Payer: Self-pay | Admitting: Family Medicine

## 2020-08-03 ENCOUNTER — Other Ambulatory Visit: Payer: Self-pay

## 2020-08-03 ENCOUNTER — Ambulatory Visit (INDEPENDENT_AMBULATORY_CARE_PROVIDER_SITE_OTHER): Payer: No Typology Code available for payment source | Admitting: Family Medicine

## 2020-08-03 ENCOUNTER — Encounter: Payer: Self-pay | Admitting: Family Medicine

## 2020-08-03 VITALS — BP 138/90 | HR 91 | Temp 98.9°F | Ht 67.0 in | Wt 209.8 lb

## 2020-08-03 DIAGNOSIS — D7589 Other specified diseases of blood and blood-forming organs: Secondary | ICD-10-CM

## 2020-08-03 DIAGNOSIS — K429 Umbilical hernia without obstruction or gangrene: Secondary | ICD-10-CM | POA: Diagnosis not present

## 2020-08-03 DIAGNOSIS — M25552 Pain in left hip: Secondary | ICD-10-CM | POA: Diagnosis not present

## 2020-08-03 DIAGNOSIS — M461 Sacroiliitis, not elsewhere classified: Secondary | ICD-10-CM

## 2020-08-03 MED ORDER — PREDNISONE 20 MG PO TABS: 40.0000 mg | ORAL_TABLET | Freq: Every day | ORAL | 0 refills | Status: DC

## 2020-08-03 NOTE — Progress Notes (Signed)
Patient: Troy Burgess MRN: [id number] DOB: 31-Aug-1976 PCP: Orma Flaming, MD     Subjective:  Chief Complaint  Patient presents with  . Hip Pain    HPI: The patient is a 44 y.o. male who presents today for left sided hip pain starting 2-3 weeks ago. He also has what he thinks is a hernia. Would like to go over lab work as well.   Left sided hip pain Started 2-3 weeks ago. He states if he turns his leg a certain way it hurts his hip. If he tries to lift his left leg up high it hurts in his hip. Not tender to push on the left hip. There is no pain sitting or walking on flat surface, but if he twists or walks funny it hurts to an 8/10. He has pain radiate into groin. He also has knee pain, but doesn't have radiation to the knee. No numbness or tingling in the left leg and no weakness in his left leg. Also seems to have pain more along pelvis, SI area than hip.   Possible hernia He states his belly button now pops out. He can push it back in no problem. He has no pain. If he pushes really hard he can feel it in his bladder more. He denies any heavy coughing, lifting. He has dry heaved with some medication. He is working on weight loss.    Will repeat labs today too for low calcium and possible iron deficiency.   Review of Systems  Constitutional: Negative for chills, fever and unexpected weight change.  Respiratory: Negative for cough and shortness of breath.   Cardiovascular: Negative for chest pain and palpitations.  Gastrointestinal: Positive for constipation. Negative for abdominal pain and nausea.  Genitourinary: Negative for decreased urine volume, frequency and urgency.  Musculoskeletal: Positive for back pain. Negative for joint swelling.    Allergies Patient is allergic to amlodipine, bupropion, ciprofloxacin, codeine, hydrocodone-acetaminophen, lisinopril, and nortriptyline.  Past Medical History Patient  has no past medical history on file.  Surgical History Patient   has no past surgical history on file.  Family History Pateint's family history is not on file.  Social History Patient  reports that he has quit smoking. He has never used smokeless tobacco. He reports current alcohol use of about 20.0 standard drinks of alcohol per week. He reports that he does not use drugs.    Objective: Vitals:   08/03/20 1429  BP: 138/90  Pulse: 91  Temp: 98.9 F (37.2 C)  TempSrc: Temporal  SpO2: 98%  Weight: 209 lb 12.8 oz (95.2 kg)  Height: 5\' 7"  (1.702 m)    Body mass index is 32.86 kg/m.  Physical Exam Vitals reviewed.  Constitutional:      Appearance: Normal appearance. He is obese.  HENT:     Head: Normocephalic and atraumatic.  Pulmonary:     Effort: Pulmonary effort is normal.  Abdominal:     General: Bowel sounds are normal.     Palpations: Abdomen is soft.     Hernia: A hernia (umbilical. reducible and small ) is present.  Musculoskeletal:     Comments: TTP over left SI joint and iliac crest. No TTP over greater trochanteric process. Gait normal. Strength intact. DTR normal. Sensation normal.   Neurological:     General: No focal deficit present.     Mental Status: He is alert and oriented to person, place, and time.     Motor: No weakness.     Gait: Gait  normal.     Deep Tendon Reflexes: Reflexes normal.        Assessment/plan: 1. Left hip pain I think his pain is more in SI joint area, but will check hip xray.  - DG Hip Unilat W OR W/O Pelvis 2-3 Views Left; Future - Ambulatory referral to Sports Medicine  2. Sacroiliitis (HCC) Lumbar xray, voltaren gel. Already in PT and asked that he work on this. Handout given with exercises as well. Referral to sports medicine and will do steroid burst since he is going to [place] and needs some relief.  - DG Lumbar Spine Complete; Future - Ambulatory referral to Sports Medicine  3. Umbilical hernia without obstruction and without gangrene Small and reducible. Will work on weight loss,  but discussed if it starts to hurt him or interfere we can send to surgeon. Strangulation precautions given.   4. Hypercalcemia Repeat labs today.  - PTH, intact (no Ca) - COMPLETE METABOLIC PANEL WITH GFR  5. Macrocytic Iron panel.  - Iron, TIBC and Ferritin Panel    This visit occurred during the SARS-CoV-2 public health emergency.  Safety protocols were in place, including screening questions prior to the visit, additional usage of staff PPE, and extensive cleaning of exam room while observing appropriate contact time as indicated for disinfecting solutions.     Return if symptoms worsen or fail to improve.   Orma Flaming, MD [place]   08/03/2020

## 2020-08-03 NOTE — Patient Instructions (Addendum)
I have ordered xrays for you. At this time we do not have xrays in our clinic. You will have to go to our [facility] clinic. The address is [address]  xray is located in the basement.  Hours of operation are M-F 8:30am to 5:00pm.  Closed for lunch between 12:30 and 1:00pm.    Sacroiliitis -inflammation of the sacro-iliac joint. I have some exercises for you to try and tell your PT about this so she can start work with you. Will do steroid burst for 5 days. I also want you to get over the counter voltaren gel and you can put on four times a day and heating pad. Will also send you to sports medicine for them to look at.   Hip xray- still wonder if this is more your SI joint.    Hernia is small. Weight loss is huge. Typically if small don't operate, but if starts to bother you call me so we can send to surgeon. If horrible pain or nausea/vomiting/fever.  go to ER   Repeat labs today for calcium and iron deficiency possibly.    Umbilical Hernia, Adult  A hernia is a bulge of tissue that pushes through an opening between muscles. An umbilical hernia happens in the abdomen, near the belly button (umbilicus). The hernia may contain tissues from the small intestine, large intestine, or fatty tissue covering the intestines (omentum). Umbilical hernias in adults tend to get worse over time, and they require surgical treatment. There are several types of umbilical hernias. You may have:  A hernia located just above or below the umbilicus (indirect hernia). This is the most common type of umbilical hernia in adults.  A hernia that forms through an opening formed by the umbilicus (direct hernia).  A hernia that comes and goes (reducible hernia). A reducible hernia may be visible only when you strain, lift something heavy, or cough. This type of hernia can be pushed back into the abdomen (reduced).  A hernia that traps abdominal tissue inside the hernia (incarcerated hernia). This type of hernia cannot  be reduced.  A hernia that cuts off blood flow to the tissues inside the hernia (strangulated hernia). The tissues can start to die if this happens. This type of hernia requires emergency treatment. What are the causes? An umbilical hernia happens when tissue inside the abdomen presses on a weak area of the abdominal muscles. What increases the risk? You may have a greater risk of this condition if you:  Are obese.  Have had several pregnancies.  Have a buildup of fluid inside your abdomen (ascites).  Have had surgery that weakens the abdominal muscles. What are the signs or symptoms? The main symptom of this condition is a painless bulge at or near the belly button. A reducible hernia may be visible only when you strain, lift something heavy, or cough. Other symptoms may include:  Dull pain.  A feeling of pressure. Symptoms of a strangulated hernia may include:  Pain that gets increasingly worse.  Nausea and vomiting.  Pain when pressing on the hernia.  Skin over the hernia becoming red or purple.  Constipation.  Blood in the stool. How is this diagnosed? This condition may be diagnosed based on:  A physical exam. You may be asked to cough or strain while standing. These actions increase the pressure inside your abdomen and force the hernia through the opening in your muscles. Your health care provider may try to reduce the hernia by pressing  on it.  Your symptoms and medical history. How is this treated? Surgery is the only treatment for an umbilical hernia. Surgery for a strangulated hernia is done as soon as possible. If you have a small hernia that is not incarcerated, you may need to lose weight before having surgery. Follow these instructions at home:  Lose weight, if told by your health care provider.  Do not try to push the hernia back in.  Watch your hernia for any changes in color or size. Tell your health care provider if any changes occur.  You may need  to avoid activities that increase pressure on your hernia.  Do not lift anything that is heavier than 10 lb (4.5 kg) until your health care provider says that this is safe.  Take over-the-counter and prescription medicines only as told by your health care provider.  Keep all follow-up visits as told by your health care provider. This is important. Contact a health care provider if:  Your hernia gets larger.  Your hernia becomes painful. Get help right away if:  You develop sudden, severe pain near the area of your hernia.  You have pain as well as nausea or vomiting.  You have pain and the skin over your hernia changes color.  You develop a fever. This information is not intended to replace advice given to you by your health care provider. Make sure you discuss any questions you have with your health care provider. Document Revised: 10/30/2017 Document Reviewed: 03/18/2017 Elsevier Patient Education  [place].

## 2020-08-04 ENCOUNTER — Other Ambulatory Visit: Payer: Self-pay | Admitting: Family Medicine

## 2020-08-04 LAB — COMPLETE METABOLIC PANEL WITH GFR
AG Ratio: 1.6 (calc) (ref 1.0–2.5)
ALT: 49 U/L — ABNORMAL HIGH (ref 9–46)
AST: 24 U/L (ref 10–40)
Albumin: 4.3 g/dL (ref 3.6–5.1)
Alkaline phosphatase (APISO): 115 U/L (ref 36–130)
BUN: 19 mg/dL (ref 7–25)
CO2: 26 mmol/L (ref 20–32)
Calcium: 9.9 mg/dL (ref 8.6–10.3)
Chloride: 104 mmol/L (ref 98–110)
Creat: 0.9 mg/dL (ref 0.60–1.35)
GFR, Est African American: 118 mL/min/{1.73_m2} (ref >=60)
GFR, Est Non African American: 102 mL/min/{1.73_m2} (ref >=60)
Globulin: 2.7 g/dL (calc) (ref 1.9–3.7)
Glucose, Bld: 84 mg/dL (ref 65–99)
Potassium: 4.7 mmol/L (ref 3.5–5.3)
Sodium: 139 mmol/L (ref 135–146)
Total Bilirubin: 0.4 mg/dL (ref 0.2–1.2)
Total Protein: 7 g/dL (ref 6.1–8.1)

## 2020-08-04 LAB — IRON,TIBC AND FERRITIN PANEL
%SAT: 6 % (calc) — ABNORMAL LOW (ref 20–48)
Ferritin: 7 ng/mL — ABNORMAL LOW (ref 38–380)
Iron: 25 ug/dL — ABNORMAL LOW (ref 50–180)
TIBC: 416 mcg/dL (calc) (ref 250–425)

## 2020-08-04 LAB — PARATHYROID HORMONE, INTACT (NO CA): PTH: 54 pg/mL (ref 14–64)

## 2020-08-04 MED ORDER — FERROUS SULFATE 324 (65 FE) MG PO TBEC: 324.0000 mg | DELAYED_RELEASE_TABLET | Freq: Every day | ORAL | 1 refills | Status: DC

## 2020-08-17 ENCOUNTER — Other Ambulatory Visit: Payer: Self-pay | Admitting: Family Medicine

## 2020-08-19 ENCOUNTER — Other Ambulatory Visit (HOSPITAL_COMMUNITY): Payer: Self-pay | Admitting: Internal Medicine

## 2020-08-22 ENCOUNTER — Other Ambulatory Visit (HOSPITAL_COMMUNITY): Payer: Self-pay | Admitting: Urology

## 2020-09-12 ENCOUNTER — Encounter: Payer: Self-pay | Admitting: Psychiatry

## 2020-09-12 ENCOUNTER — Other Ambulatory Visit: Payer: Self-pay | Admitting: Psychiatry

## 2020-09-12 ENCOUNTER — Ambulatory Visit (INDEPENDENT_AMBULATORY_CARE_PROVIDER_SITE_OTHER): Payer: No Typology Code available for payment source | Admitting: Psychiatry

## 2020-09-12 ENCOUNTER — Other Ambulatory Visit: Payer: Self-pay

## 2020-09-12 VITALS — BP 143/92 | HR 97 | Ht 68.0 in | Wt 208.0 lb

## 2020-09-12 DIAGNOSIS — F5105 Insomnia due to other mental disorder: Secondary | ICD-10-CM | POA: Diagnosis not present

## 2020-09-12 DIAGNOSIS — F1320 Sedative, hypnotic or anxiolytic dependence, uncomplicated: Secondary | ICD-10-CM

## 2020-09-12 MED ORDER — TRAZODONE HCL 50 MG PO TABS: ORAL_TABLET | ORAL | 0 refills | Status: DC

## 2020-09-12 MED ORDER — QUETIAPINE FUMARATE 25 MG PO TABS: ORAL_TABLET | ORAL | 0 refills | Status: DC

## 2020-09-12 NOTE — Patient Instructions (Addendum)
Trazodone 50-100 mg at night with 1/2 of Ambien for 7-10 days and then stop it.  If quetiapine 25-50 mg in place of trazodone if trazodone fails.
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
## 2020-09-15 ENCOUNTER — Other Ambulatory Visit (HOSPITAL_COMMUNITY): Payer: Self-pay | Admitting: Internal Medicine

## 2020-10-11 ENCOUNTER — Telehealth: Payer: Self-pay

## 2020-10-11 ENCOUNTER — Encounter: Payer: Self-pay | Admitting: Physician Assistant

## 2020-10-11 DIAGNOSIS — M25469 Effusion, unspecified knee: Secondary | ICD-10-CM

## 2020-10-11 DIAGNOSIS — Z1211 Encounter for screening for malignant neoplasm of colon: Secondary | ICD-10-CM

## 2020-10-11 NOTE — Telephone Encounter (Signed)
Melitta can you clarify this? I typically do not order the endoscopy. That is something they decide to do??  Dr. Rogers Blocker

## 2020-10-11 NOTE — Telephone Encounter (Signed)
Pt says that he had one done last 2020. He says that because he has stomach ulcers and bleeding that the endoscopy was done by GI. I gave message below. Pt voiced understanding.

## 2020-10-11 NOTE — Telephone Encounter (Signed)
Patient is calling in stating that Santina Evans is needing orders for an endoscopy, when he goes to do the Colonoscopy.

## 2020-10-13 NOTE — Addendum Note (Signed)
Addended by: Orma Flaming on: 10/13/2020 03:35 PM   Modules accepted: Orders

## 2020-10-17 ENCOUNTER — Other Ambulatory Visit: Payer: Self-pay | Admitting: Psychiatry

## 2020-10-17 DIAGNOSIS — F5105 Insomnia due to other mental disorder: Secondary | ICD-10-CM

## 2020-10-19 ENCOUNTER — Other Ambulatory Visit (HOSPITAL_COMMUNITY): Payer: Self-pay | Admitting: Physician Assistant

## 2020-10-31 ENCOUNTER — Ambulatory Visit: Payer: No Typology Code available for payment source | Admitting: Physician Assistant

## 2020-10-31 ENCOUNTER — Encounter: Payer: Self-pay | Admitting: Physician Assistant

## 2020-10-31 ENCOUNTER — Other Ambulatory Visit: Payer: Self-pay | Admitting: Physician Assistant

## 2020-10-31 ENCOUNTER — Other Ambulatory Visit (INDEPENDENT_AMBULATORY_CARE_PROVIDER_SITE_OTHER): Payer: No Typology Code available for payment source

## 2020-10-31 VITALS — BP 134/94 | HR 102 | Ht 68.0 in | Wt 212.4 lb

## 2020-10-31 DIAGNOSIS — D509 Iron deficiency anemia, unspecified: Secondary | ICD-10-CM

## 2020-10-31 DIAGNOSIS — K921 Melena: Secondary | ICD-10-CM | POA: Diagnosis not present

## 2020-10-31 DIAGNOSIS — R1013 Epigastric pain: Secondary | ICD-10-CM

## 2020-10-31 LAB — CBC WITH DIFFERENTIAL/PLATELET
Basophils Absolute: 0.1 10*3/uL (ref 0.0–0.1)
Basophils Relative: 0.8 % (ref 0.0–3.0)
Eosinophils Absolute: 0.1 10*3/uL (ref 0.0–0.7)
Eosinophils Relative: 1 % (ref 0.0–5.0)
HCT: 45.6 % (ref 39.0–52.0)
Hemoglobin: 15.2 g/dL (ref 13.0–17.0)
Lymphocytes Relative: 20.7 % (ref 12.0–46.0)
Lymphs Abs: 2 10*3/uL (ref 0.7–4.0)
MCHC: 33.4 g/dL (ref 30.0–36.0)
MCV: 83.1 fl (ref 78.0–100.0)
Monocytes Absolute: 1 10*3/uL (ref 0.1–1.0)
Monocytes Relative: 9.9 % (ref 3.0–12.0)
Neutro Abs: 6.6 10*3/uL (ref 1.4–7.7)
Neutrophils Relative %: 67.6 % (ref 43.0–77.0)
Platelets: 273 10*3/uL (ref 150.0–400.0)
RBC: 5.49 Mil/uL (ref 4.22–5.81)
RDW: 17.4 % — ABNORMAL HIGH (ref 11.5–15.5)
WBC: 9.8 10*3/uL (ref 4.0–10.5)

## 2020-10-31 LAB — IBC + FERRITIN
Ferritin: 41.6 ng/mL (ref 22.0–322.0)
Iron: 50 ug/dL (ref 42–165)
Saturation Ratios: 13 % — ABNORMAL LOW (ref 20.0–50.0)
Transferrin: 275 mg/dL (ref 212.0–360.0)

## 2020-10-31 MED ORDER — LANSOPRAZOLE 30 MG PO CPDR: 30.0000 mg | DELAYED_RELEASE_CAPSULE | Freq: Every morning | ORAL | 3 refills | Status: DC

## 2020-10-31 MED ORDER — FERROUS SULFATE 324 (65 FE) MG PO TBEC: 324.0000 mg | DELAYED_RELEASE_TABLET | Freq: Two times a day (BID) | ORAL | 1 refills | Status: DC

## 2020-10-31 MED ORDER — NA SULFATE-K SULFATE-MG SULF 17.5-3.13-1.6 GM/177ML PO SOLN: 1.0000 | Freq: Once | ORAL | 0 refills | Status: DC

## 2020-10-31 NOTE — Patient Instructions (Signed)
If you are age 47 or older, your body mass index should be between 23-30. Your Body mass index is 32.29 kg/m. If this is out of the aforementioned range listed, please consider follow up with your Primary Care Provider.  If you are age 22 or younger, your body mass index should be between 19-25. Your Body mass index is 32.29 kg/m. If this is out of the aformentioned range listed, please consider follow up with your Primary Care Provider.   Your provider has requested that you go to the basement level for lab work before leaving today. Press "B" on the elevator. The lab is located at the first door on the left as you exit the elevator.  You have been scheduled for an endoscopy and colonoscopy. Please follow the written instructions given to you at your visit today. Please pick up your prep supplies at the pharmacy within the next 1-3 days. If you use inhalers (even only as needed), please bring them with you on the day of your procedure.  We have sent in refills of Prevacid and Iron. We are having you increase your Iron to twice a day.  Start your Iron once you have completed your procedure.  Follow up pending at this time.  Thank you for entrusting me with your care and choosing [facility].  Amy Esterwood, PA-C

## 2020-10-31 NOTE — Progress Notes (Signed)
Subjective:    Patient ID: Troy Burgess, male    DOB: 27-Jul-1976, 45 y.o.   MRN: [id number]  HPI Troy Burgess is a pleasant 45 year old white male, new to GI today referred by Dr. Orma Flaming with complaints of intermittent black stools and epigastric pain. Patient has history of hypertension, asthma, GERD, ADD, anxiety and hyperlipidemia. He did undergo EGD in January 2020 done through [facility] with complaints of dysphagia.  That showed mild gastritis, no esophageal stricture.  Gastric biopsy showed mild chronic gastritis no H. pylori and biopsies of the esophagus showed no evidence of intestinal metaplasia. Patient thinks that he may have had 1 very remote colonoscopy 15 years or so ago. Most recent labs done November 2021 showed an iron deficiency with serum iron of 25, TIBC of 416 iron saturation of 6 and ferritin of 7.  Last CBC October 2021 hemoglobin 13.2 MCV of 74.6. CBC in 2019 hemoglobin of 16, no MCV done. Patient says his current symptoms have been present over the past 2 to 3 months with intermittent what he describes as tarry black stools.  He says his stools have been darker since he has been having on iron but will intermittently have a very black stool.  He has not noticed any overt blood.  He describes epigastric abdominal pain which has been ongoing over the past few months exacerbated by certain foods including wine.  He had an episode about a month ago after drinking a glass of wine on an empty stomach which induced cold sweats and then dry heaves.  He is says that has never been a problem in the past.  He is chronically on Prevacid 30 mg daily.  Appetite has been fine, weight is actually up over the past couple of years.  He describes chronic constipation and straining but not to the point of requiring laxatives.  Denies any regular NSAID use, usually drinks 2 glasses of wine per day. Family history is negative for colon cancer and polyps. Patient says he is concerned about colon  cancer as he had been on high-dose testosterone for about 1-1/2 years and was told this would increase his risk.  The testosterone had been discontinued previously.  Review of Systems Pertinent positive and negative review of systems were noted in the above HPI section.  All other review of systems was otherwise negative.  Outpatient Encounter Medications as of 10/31/2020  Medication Sig  . albuterol (VENTOLIN HFA) 108 (90 Base) MCG/ACT inhaler Inhale 2 puffs into the lungs every 6 (six) hours as needed for wheezing or shortness of breath.  Marland Kitchen atorvastatin (LIPITOR) 40 MG tablet Take 1 tablet (40 mg total) by mouth daily at 6 PM.  . escitalopram (LEXAPRO) 10 MG tablet TAKE 1 TABLET (10 MG TOTAL) BY MOUTH DAILY.  . fluticasone (FLOVENT HFA) 220 MCG/ACT inhaler Inhale 1 puff into the lungs 2 (two) times daily.  Marland Kitchen lisdexamfetamine (VYVANSE) 50 MG capsule 70 mg.   . losartan (COZAAR) 50 MG tablet TAKE 1 TABLET (50 MG TOTAL) BY MOUTH DAILY.  . montelukast (SINGULAIR) 10 MG tablet TAKE 1 TABLET (10 MG TOTAL) BY MOUTH AT BEDTIME.  . Multiple Vitamin (MULTIVITAMIN) capsule Take by mouth.  . Na Sulfate-K Sulfate-Mg Sulf 17.5-3.13-1.6 GM/177ML SOLN Take 1 kit by mouth once for 1 dose. Apply Coupon=BIN: [id number] PCN: CN GROUP: [id number] ID: [id number]; NO prior authorization  . traZODone (DESYREL) 50 MG tablet TAKE 1 TO 2 TABLET AT BEDTIME AS NEEDED FOR SLEEP.  Marland Kitchen zolpidem (AMBIEN) 10  MG tablet TAKE 1 TABLET BY MOUTH NIGHTLY AS NEEDED FOR SLEEP  . [DISCONTINUED] ferrous sulfate 324 (65 Fe) MG TBEC Take 1 tablet (324 mg total) by mouth daily.  . [DISCONTINUED] lansoprazole (PREVACID) 30 MG capsule TAKE 1 CAPSULE (30 MG TOTAL) BY MOUTH DAILY AT 12 NOON.  . ferrous sulfate 324 (65 Fe) MG TBEC Take 1 tablet (324 mg total) by mouth 2 (two) times daily.  . lansoprazole (PREVACID) 30 MG capsule Take 1 capsule (30 mg total) by mouth in the morning.  . [DISCONTINUED] fluticasone (FLONASE) 50 MCG/ACT nasal spray Place 2  sprays into both nostrils daily. (Patient not taking: Reported on 10/31/2020)  . [DISCONTINUED] predniSONE (DELTASONE) 20 MG tablet Take 2 tablets (40 mg total) by mouth daily with breakfast. (Patient not taking: Reported on 10/31/2020)  . [DISCONTINUED] QUEtiapine (SEROQUEL) 25 MG tablet TAKE 1 TO 2 TABLETS AT NIGHT AS NEEDED FOR SEVERE INSOMNIA IN PLACE OF TRAZODONE (Patient not taking: Reported on 10/31/2020)   No facility-administered encounter medications on file as of 10/31/2020.   Allergies  Allergen Reactions  . Amlodipine Swelling    Pedal edema  . Bupropion     Other reaction(s): Malaise (intolerance)  . Ciprofloxacin     Other reaction(s): Myalgias (intolerance)  . Codeine Nausea And Vomiting and Nausea Only  . Hydrocodone-Acetaminophen Nausea And Vomiting  . Lisinopril     Other reaction(s): Cough (ALLERGY/intolerance)  . Nortriptyline     Other reaction(s): Other (See Comments) unknown   Patient Active Problem List   Diagnosis Date Noted  . ADD (attention deficit disorder) 07/20/2019  . Anxiety 07/20/2019  . Gastroesophageal reflux disease with esophagitis 09/11/2018  . Hypogonadism, male 08/29/2017  . Pre-diabetes 08/29/2017  . Hypertension, essential 12/23/2012  . Chronic midline low back pain without sciatica 09/16/2011  . Genital herpes 09/16/2011  . Male pattern alopecia 09/16/2011  . Hyperlipidemia 09/11/2011  . Insomnia 09/11/2011  . Moderate persistent asthma without complication 78/24/2353   Social History   Socioeconomic History  . Marital status: Married    Spouse name: Not on file  . Number of children: Not on file  . Years of education: Not on file  . Highest education level: Not on file  Occupational History  . Not on file  Tobacco Use  . Smoking status: Former Smoker    Types: Cigarettes  . Smokeless tobacco: Never Used  Vaping Use  . Vaping Use: Never used  Substance and Sexual Activity  . Alcohol use: Yes    Alcohol/week: 2.0 standard  drinks    Types: 2 Glasses of wine per week    Comment: 2 glasses of wine each day  . Drug use: Never  . Sexual activity: Not on file  Other Topics Concern  . Not on file  Social History Narrative  . Not on file   Social Determinants of Health   Financial Resource Strain: Not on file  Food Insecurity: Not on file  Transportation Needs: Not on file  Physical Activity: Not on file  Stress: Not on file  Social Connections: Not on file  Intimate Partner Violence: Not on file    Troy Burgess's family history includes Hypertension in his father and mother.      Objective:    Vitals:   10/31/20 1541  BP: (!) 134/94  Pulse: (!) 102    Physical Exam Well-developed well-nourished WM  in no acute distress.  Height, Weight, BMI 32  HEENT; nontraumatic normocephalic, EOMI, PE R LA, sclera anicteric.  Oropharynx; not examined Neck; supple, no JVD Cardiovascular; regular rate and rhythm with S1-S2, no murmur rub or gallop Pulmonary; Clear bilaterally Abdomen; soft, mild tenderness in the left mid quadrant, nondistended, no palpable mass or hepatosplenomegaly, bowel sounds are active Rectal; drak brown , heme negative stool Skin; benign exam, no jaundice rash or appreciable lesions Extremities; no clubbing cyanosis or edema skin warm and dry Neuro/Psych; alert and oriented x4, grossly nonfocal mood and affect appropriate       Assessment & Plan:   #10 45 year old white male with history of chronic GERD, now with 2 to 9-monthhistory of frequent epigastric pain, occasional nausea and intermittent black stool. He was recently diagnosed with iron deficiency November 2021 of unclear etiology will need to rule out intermittent occult GI blood loss.  Stool Hemoccult negative today Rule out gastropathy, peptic ulcer disease, occult neoplasm.  #2 hypertension 3.  Asthma 4.  ADD 5.  Anxiety  Plan; CBC with differential, repeat iron studies Continue Prevacid 30 mg p.o. every morning  patient advised to avoid all aspirin and NSAID's and decrease EtOH use Increase ferrous sulfate to 325 mg p.o. twice daily, prescription sent Patient will be scheduled for EGD and colonoscopy with Dr. CBryan Lemma  Procedures were discussed in detail with the patient including indications risk and benefits and he is agreeable to proceed. Patient has had COVID-19 and has completed vaccination. Further recommendations pending findings at endoscopic evaluation.    Deauna Yaw S Gayle Martinez PA-C 10/31/2020   Cc: WOrma Flaming MD

## 2020-11-02 ENCOUNTER — Ambulatory Visit: Payer: No Typology Code available for payment source | Admitting: Psychiatry

## 2020-11-02 NOTE — Progress Notes (Signed)
Agree with the assessment and plan as outlined by Amy Esterwood, PA-C.  Adiel Erney, DO, FACG  

## 2020-11-07 ENCOUNTER — Other Ambulatory Visit: Payer: Self-pay | Admitting: Family Medicine

## 2020-11-07 ENCOUNTER — Encounter: Payer: Self-pay | Admitting: Gastroenterology

## 2020-11-07 ENCOUNTER — Other Ambulatory Visit: Payer: Self-pay

## 2020-11-07 ENCOUNTER — Telehealth: Payer: Self-pay | Admitting: Physician Assistant

## 2020-11-07 MED ORDER — NA SULFATE-K SULFATE-MG SULF 17.5-3.13-1.6 GM/177ML PO SOLN: ORAL | 0 refills | Status: DC

## 2020-11-07 NOTE — Telephone Encounter (Signed)
Inbound call from patient stating he did not go pick up his prep and is requesting it be resent to [place] please.

## 2020-11-07 NOTE — Telephone Encounter (Signed)
Ambien last rx 08/17/20 #90 LOV: 08/03/20 Sacroiliitis NOV: 12/22/20

## 2020-11-07 NOTE — Addendum Note (Signed)
Addended by: Orma Flaming on: 11/07/2020 01:06 PM   Modules accepted: Orders

## 2020-11-08 ENCOUNTER — Ambulatory Visit (AMBULATORY_SURGERY_CENTER): Payer: No Typology Code available for payment source | Admitting: Gastroenterology

## 2020-11-08 ENCOUNTER — Encounter: Payer: Self-pay | Admitting: Gastroenterology

## 2020-11-08 ENCOUNTER — Other Ambulatory Visit: Payer: Self-pay

## 2020-11-08 VITALS — BP 116/68 | HR 76 | Temp 97.8°F | Resp 22 | Ht 68.0 in | Wt 212.0 lb

## 2020-11-08 DIAGNOSIS — K2990 Gastroduodenitis, unspecified, without bleeding: Secondary | ICD-10-CM

## 2020-11-08 DIAGNOSIS — D124 Benign neoplasm of descending colon: Secondary | ICD-10-CM

## 2020-11-08 DIAGNOSIS — R1013 Epigastric pain: Secondary | ICD-10-CM

## 2020-11-08 DIAGNOSIS — D125 Benign neoplasm of sigmoid colon: Secondary | ICD-10-CM

## 2020-11-08 DIAGNOSIS — K640 First degree hemorrhoids: Secondary | ICD-10-CM

## 2020-11-08 DIAGNOSIS — K921 Melena: Secondary | ICD-10-CM

## 2020-11-08 DIAGNOSIS — D123 Benign neoplasm of transverse colon: Secondary | ICD-10-CM

## 2020-11-08 DIAGNOSIS — K5730 Diverticulosis of large intestine without perforation or abscess without bleeding: Secondary | ICD-10-CM

## 2020-11-08 DIAGNOSIS — K2970 Gastritis, unspecified, without bleeding: Secondary | ICD-10-CM | POA: Diagnosis not present

## 2020-11-08 DIAGNOSIS — B9681 Helicobacter pylori [H. pylori] as the cause of diseases classified elsewhere: Secondary | ICD-10-CM | POA: Diagnosis not present

## 2020-11-08 DIAGNOSIS — K648 Other hemorrhoids: Secondary | ICD-10-CM | POA: Diagnosis not present

## 2020-11-08 DIAGNOSIS — D509 Iron deficiency anemia, unspecified: Secondary | ICD-10-CM | POA: Diagnosis not present

## 2020-11-08 DIAGNOSIS — K2951 Unspecified chronic gastritis with bleeding: Secondary | ICD-10-CM | POA: Diagnosis not present

## 2020-11-08 HISTORY — PX: COLONOSCOPY: SHX174

## 2020-11-08 HISTORY — PX: UPPER GASTROINTESTINAL ENDOSCOPY: SHX188

## 2020-11-08 MED ORDER — SODIUM CHLORIDE 0.9 % IV SOLN: 500.0000 mL | Freq: Once | INTRAVENOUS | Status: DC

## 2020-11-08 NOTE — Op Note (Signed)
[facility] Patient Name: Troy Burgess Procedure Date: 11/08/2020 3:10 PM MRN: [id number] Endoscopist: Gerrit Heck , MD Age: 45 Referring MD:  Date of Birth: 28-Feb-1976 Gender: Male Account #: [id number] Procedure:                Colonoscopy Indications:              Epigastric abdominal pain, Iron deficiency anemia Medicines:                Monitored Anesthesia Care Procedure:                Pre-Anesthesia Assessment:                           - Prior to the procedure, a History and Physical                            was performed, and patient medications and                            allergies were reviewed. The patient's tolerance of                            previous anesthesia was also reviewed. The risks                            and benefits of the procedure and the sedation                            options and risks were discussed with the patient.                            All questions were answered, and informed consent                            was obtained. Prior Anticoagulants: The patient has                            taken no previous anticoagulant or antiplatelet                            agents. ASA Grade Assessment: II - A patient with                            mild systemic disease. After reviewing the risks                            and benefits, the patient was deemed in                            satisfactory condition to undergo the procedure.                           After obtaining informed consent, the colonoscope  was passed under direct vision. Throughout the                            procedure, the patient's blood pressure, pulse, and                            oxygen saturations were monitored continuously. The                            Olympus CF-HQ190L [id number]) Colonoscope was                            introduced through the anus and advanced to the the                            cecum,  identified by appendiceal orifice and                            ileocecal valve. The colonoscopy was performed                            without difficulty. The patient tolerated the                            procedure well. The quality of the bowel                            preparation was good. The ileocecal valve,                            appendiceal orifice, and rectum were photographed. Scope In: 3:32:29 PM Scope Out: 3:47:30 PM Scope Withdrawal Time: 0 hours 10 minutes 44 seconds  Total Procedure Duration: 0 hours 15 minutes 1 second  Findings:                 The perianal and digital rectal examinations were                            normal.                           Four sessile polyps were found in the sigmoid colon                            (1), descending colon (2), and transverse colon                            (1). The polyps were 3 to 5 mm in size. These                            polyps were removed with a cold snare. Resection                            and retrieval were complete. Estimated blood loss  was minimal.                           A few small-mouthed diverticula were found in the                            sigmoid colon.                           Non-bleeding internal hemorrhoids were found during                            retroflexion. The hemorrhoids were small.                           Due to looping in the ascending colon, unable to                            fully intubate the ileum. The exam was otherwise                            normal throughout the remainder of the colon. Complications:            No immediate complications. Estimated Blood Loss:     Estimated blood loss was minimal. Impression:               - Four 3 to 5 mm polyps in the sigmoid colon, in                            the descending colon and in the transverse colon,                            removed with a cold snare. Resected and retrieved.                            - Diverticulosis in the sigmoid colon.                           - Non-bleeding internal hemorrhoids. Recommendation:           - Patient has a contact number available for                            emergencies. The signs and symptoms of potential                            delayed complications were discussed with the                            patient. Return to normal activities tomorrow.                            Written discharge instructions were provided to the  patient.                           - Resume previous diet.                           - Continue present medications.                           - Await pathology results.                           - Repeat colonoscopy for surveillance based on                            pathology results.                           - Return to GI office in 2 months. Gerrit Heck, MD 11/08/2020 3:56:08 PM

## 2020-11-08 NOTE — Progress Notes (Signed)
Called to room to assist during endoscopic procedure.  Patient ID and intended procedure confirmed with present staff. Received instructions for my participation in the procedure from the performing physician.  

## 2020-11-08 NOTE — Patient Instructions (Signed)
Please read handouts provided. Continue present medications. Await pathology results. Return to GI office in 2 months.     YOU HAD AN ENDOSCOPIC PROCEDURE TODAY AT [facility]:   Refer to the procedure report that was given to you for any specific questions about what was found during the examination.  If the procedure report does not answer your questions, please call your gastroenterologist to clarify.  If you requested that your care partner not be given the details of your procedure findings, then the procedure report has been included in a sealed envelope for you to review at your convenience later.  YOU SHOULD EXPECT: Some feelings of bloating in the abdomen. Passage of more gas than usual.  Walking can help get rid of the air that was put into your GI tract during the procedure and reduce the bloating. If you had a lower endoscopy (such as a colonoscopy or flexible sigmoidoscopy) you may notice spotting of blood in your stool or on the toilet paper. If you underwent a bowel prep for your procedure, you may not have a normal bowel movement for a few days.  Please Note:  You might notice some irritation and congestion in your nose or some drainage.  This is from the oxygen used during your procedure.  There is no need for concern and it should clear up in a day or so.  SYMPTOMS TO REPORT IMMEDIATELY:   Following lower endoscopy (colonoscopy or flexible sigmoidoscopy):  Excessive amounts of blood in the stool  Significant tenderness or worsening of abdominal pains  Swelling of the abdomen that is new, acute  Fever of 100F or higher   Following upper endoscopy (EGD)  Vomiting of blood or coffee ground material  New chest pain or pain under the shoulder blades  Painful or persistently difficult swallowing  New shortness of breath  Fever of 100F or higher  Black, tarry-looking stools  For urgent or emergent issues, a gastroenterologist can be reached at any hour by  calling [phone]. Do not use MyChart messaging for urgent concerns.    DIET:  We do recommend a small meal at first, but then you may proceed to your regular diet.  Drink plenty of fluids but you should avoid alcoholic beverages for 24 hours.  ACTIVITY:  You should plan to take it easy for the rest of today and you should NOT DRIVE or use heavy machinery until tomorrow (because of the sedation medicines used during the test).    FOLLOW UP: Our staff will call the number listed on your records 48-72 hours following your procedure to check on you and address any questions or concerns that you may have regarding the information given to you following your procedure. If we do not reach you, we will leave a message.  We will attempt to reach you two times.  During this call, we will ask if you have developed any symptoms of COVID 19. If you develop any symptoms (ie: fever, flu-like symptoms, shortness of breath, cough etc.) before then, please call [phone].  If you test positive for Covid 19 in the 2 weeks post procedure, please call and report this information to [place].    If any biopsies were taken you will be contacted by phone or by letter within the next 1-3 weeks.  Please call us at [phone] if you have not heard about the biopsies in 3 weeks.    SIGNATURES/CONFIDENTIALITY: You and/or your care partner have signed paperwork which  will be entered into your electronic medical record.  These signatures attest to the fact that that the information above on your After Visit Summary has been reviewed and is understood.  Full responsibility of the confidentiality of this discharge information lies with you and/or your care-partner.

## 2020-11-08 NOTE — Progress Notes (Signed)
To PACU, VSS. Report to [email or web address] 

## 2020-11-08 NOTE — Progress Notes (Signed)
Pt's states no medical or surgical changes since previsit or office visit.  Vitals SB

## 2020-11-08 NOTE — Op Note (Signed)
[facility] Patient Name: Troy Burgess Procedure Date: 11/08/2020 3:11 PM MRN: [id number] Endoscopist: Gerrit Heck , MD Age: 45 Referring MD:  Date of Birth: 04/23/1976 Gender: Male Account #: [id number] Procedure:                Upper GI endoscopy Indications:              Epigastric abdominal pain, Iron deficiency anemia,                            Heartburn, Suspected esophageal reflux, Melena Medicines:                Monitored Anesthesia Care Procedure:                Pre-Anesthesia Assessment:                           - Prior to the procedure, a History and Physical                            was performed, and patient medications and                            allergies were reviewed. The patient's tolerance of                            previous anesthesia was also reviewed. The risks                            and benefits of the procedure and the sedation                            options and risks were discussed with the patient.                            All questions were answered, and informed consent                            was obtained. Prior Anticoagulants: The patient has                            taken no previous anticoagulant or antiplatelet                            agents. ASA Grade Assessment: II - A patient with                            mild systemic disease. After reviewing the risks                            and benefits, the patient was deemed in                            satisfactory condition to undergo the procedure.  After obtaining informed consent, the endoscope was                            passed under direct vision. Throughout the                            procedure, the patient's blood pressure, pulse, and                            oxygen saturations were monitored continuously. The                            Endoscope was introduced through the mouth, and                            advanced  to the second part of duodenum. The upper                            GI endoscopy was accomplished without difficulty.                            The patient tolerated the procedure well. Scope In: Scope Out: Findings:                 The examined esophagus was normal.                           The Z-line was regular and was found 40 cm from the                            incisors.                           Scattered mild inflammation characterized by                            erythema was found in the gastric body and in the                            gastric antrum. Biopsies were taken with a cold                            forceps for Helicobacter pylori testing. Estimated                            blood loss was minimal.                           The examined duodenum was normal. Biopsies for                            histology were taken with a cold forceps for                            evaluation of celiac  disease. Estimated blood loss                            was minimal. Complications:            No immediate complications. Estimated Blood Loss:     Estimated blood loss was minimal. Impression:               - Normal esophagus.                           - Z-line regular, 40 cm from the incisors.                           - Gastritis. Biopsied.                           - Normal examined duodenum. Biopsied. Recommendation:           - Patient has a contact number available for                            emergencies. The signs and symptoms of potential                            delayed complications were discussed with the                            patient. Return to normal activities tomorrow.                            Written discharge instructions were provided to the                            patient.                           - Resume previous diet.                           - Continue present medications.                           - Await pathology results.                            - Perform a colonoscopy today.                           - If today's colonoscopy and these biopsies                            unrevealing, plan for video capsule endoscopy for                            further small bowel interrogation.                           -  Return to GI clinic in 2 months. Gerrit Heck, MD 11/08/2020 3:52:33 PM

## 2020-11-10 ENCOUNTER — Telehealth: Payer: Self-pay

## 2020-11-10 ENCOUNTER — Other Ambulatory Visit: Payer: Self-pay | Admitting: Family Medicine

## 2020-11-10 NOTE — Telephone Encounter (Signed)
  Follow up Call-  Call back number 11/08/2020  Post procedure Call Back phone  # [phone]  Permission to leave phone message Yes     Patient questions:  Do you have a fever, pain , or abdominal swelling? No. Pain Score  0 *  Have you tolerated food without any problems? Yes.    Have you been able to return to your normal activities? Yes.    Do you have any questions about your discharge instructions: Diet   No. Medications  No. Follow up visit  No.  Do you have questions or concerns about your Care? No.  Actions: * If pain score is 4 or above: No action needed, pain <4.  1. Have you developed a fever since your procedure? no  2.   Have you had an respiratory symptoms (SOB or cough) since your procedure? no  3.   Have you tested positive for COVID 19 since your procedure no  4.   Have you had any family members/close contacts diagnosed with the COVID 19 since your procedure?  no   If yes to any of these questions please route to Joylene John, RN and Joella Prince, RN

## 2020-11-10 NOTE — Telephone Encounter (Signed)
NO ANSWER, MESSAGE LEFT FOR PATIENT. 

## 2020-11-15 ENCOUNTER — Other Ambulatory Visit (HOSPITAL_COMMUNITY): Payer: Self-pay | Admitting: Gastroenterology

## 2020-11-15 ENCOUNTER — Telehealth: Payer: Self-pay | Admitting: General Surgery

## 2020-11-15 MED ORDER — DOXYCYCLINE MONOHYDRATE 100 MG PO TABS: 100.0000 mg | ORAL_TABLET | Freq: Two times a day (BID) | ORAL | 0 refills | Status: AC

## 2020-11-15 MED ORDER — OMEPRAZOLE 20 MG PO CPDR: 20.0000 mg | DELAYED_RELEASE_CAPSULE | Freq: Two times a day (BID) | ORAL | 0 refills | Status: DC

## 2020-11-15 MED ORDER — METRONIDAZOLE 250 MG PO TABS: 250.0000 mg | ORAL_TABLET | Freq: Four times a day (QID) | ORAL | 0 refills | Status: DC

## 2020-11-15 NOTE — Telephone Encounter (Signed)
Left a voicemail for the patient to contact me back to discuss the pathology. I also advised him that I will send a mychart message regarding this and he can send me a response to let me know he is in receipt and understands

## 2020-11-15 NOTE — Telephone Encounter (Signed)
-----   Message from [place], DO sent at 11/14/2020  3:04 PM EST ----- Please relay the following results from the recent colonoscopy/upper endoscopy. Colonoscopy: -2 of the polyps resected during the colonoscopy were Tubular Adenomas.  These are considered benign, but precancerous.  These were removed entirely at the time of colonoscopy. -2 of the polyps resected were Hyperplastic Polyps.  These are benign and harbor no malignant potential. -Recommend repeat colonoscopy in 7 years for ongoing polyp surveillance  Upper Endoscopy: -The biopsies taken from your small intestine were normal and there was no evidence of Celiac Disease.  -The biopsies from the recent upper GI Endoscopy were notable for H. Pylori gastritis, and will plan on treating with quad therapy as below. Please confirm no medication allergies to the prescribed regimen.   1) Omeprazole 20 mg 2 times a day x 14 d 2) Pepto Bismol 2 tabs (262 mg each) 4 times a day x 14 d 3) Metronidazole 250 mg 4 times a day x 14 d 4) doxycycline 100 mg 2 times a day x 14 d  After 14 days, ok to stop omeprazole.  -4 weeks after treatment completed, check H. Pylori stool antigen to confirm eradication (must be off acid suppression therapy) -Repeat CBC and iron panel in 2-3 months.  If still iron deficient despite adequate treatment of H. pylori, plan for small bowel interrogation with video capsule endoscopy

## 2020-11-15 NOTE — Telephone Encounter (Signed)
Sent rxs to [facility] pharmacy

## 2020-11-17 ENCOUNTER — Other Ambulatory Visit (HOSPITAL_COMMUNITY): Payer: Self-pay | Admitting: Internal Medicine

## 2020-12-21 ENCOUNTER — Other Ambulatory Visit (HOSPITAL_COMMUNITY): Payer: Self-pay | Admitting: Internal Medicine

## 2020-12-21 ENCOUNTER — Ambulatory Visit: Payer: Self-pay | Attending: Internal Medicine

## 2020-12-21 DIAGNOSIS — Z23 Encounter for immunization: Secondary | ICD-10-CM

## 2020-12-21 NOTE — Progress Notes (Signed)
° °  Covid-19 Vaccination Clinic  Name:  Troy Burgess    MRN: [id number] DOB: 09-Sep-1976  12/21/2020  Troy Burgess was observed post Covid-19 immunization for 15 minutes without incident. He was provided with Vaccine Information Sheet and instruction to access the V-Safe system.   Troy Burgess was instructed to call 911 with any severe reactions post vaccine:  Difficulty breathing   Swelling of face and throat   A fast heartbeat   A bad rash all over body   Dizziness and weakness   Immunizations Administered    Name Date Dose VIS Date Route   PFIZER Comrnaty(Gray TOP) Covid-19 Vaccine 12/21/2020 12:00 AM 0.3 mL 09/08/2020 Intramuscular   Manufacturer: [employer]   Lot: [place]   [place]: [id number]

## 2020-12-22 ENCOUNTER — Other Ambulatory Visit (HOSPITAL_COMMUNITY): Payer: Self-pay | Admitting: Internal Medicine

## 2020-12-22 ENCOUNTER — Ambulatory Visit: Payer: No Typology Code available for payment source | Admitting: Family Medicine

## 2020-12-29 ENCOUNTER — Ambulatory Visit: Payer: No Typology Code available for payment source | Admitting: Family Medicine

## 2020-12-29 DIAGNOSIS — Z0289 Encounter for other administrative examinations: Secondary | ICD-10-CM

## 2021-01-06 ENCOUNTER — Other Ambulatory Visit (HOSPITAL_COMMUNITY): Payer: Self-pay

## 2021-01-06 ENCOUNTER — Other Ambulatory Visit: Payer: Self-pay | Admitting: Family Medicine

## 2021-01-06 ENCOUNTER — Encounter: Payer: Self-pay | Admitting: Family Medicine

## 2021-01-06 ENCOUNTER — Other Ambulatory Visit: Payer: Self-pay

## 2021-01-06 ENCOUNTER — Ambulatory Visit (INDEPENDENT_AMBULATORY_CARE_PROVIDER_SITE_OTHER): Payer: No Typology Code available for payment source | Admitting: Family Medicine

## 2021-01-06 VITALS — BP 120/84 | HR 110 | Temp 98.8°F | Ht 68.0 in | Wt 205.6 lb

## 2021-01-06 DIAGNOSIS — F419 Anxiety disorder, unspecified: Secondary | ICD-10-CM

## 2021-01-06 DIAGNOSIS — R7303 Prediabetes: Secondary | ICD-10-CM

## 2021-01-06 DIAGNOSIS — I10 Essential (primary) hypertension: Secondary | ICD-10-CM | POA: Diagnosis not present

## 2021-01-06 DIAGNOSIS — M722 Plantar fascial fibromatosis: Secondary | ICD-10-CM

## 2021-01-06 MED ORDER — DOXYCYCLINE MONOHYDRATE 100 MG PO CAPS
100.0000 mg | ORAL_CAPSULE | Freq: Two times a day (BID) | ORAL | 0 refills | Status: DC
  Filled 2021-01-06: qty 28, 14d supply, fill #0

## 2021-01-06 MED ORDER — LOSARTAN POTASSIUM 50 MG PO TABS
50.0000 mg | ORAL_TABLET | Freq: Every day | ORAL | 1 refills | Status: DC
  Filled 2021-01-06 – 2021-05-08 (×2): qty 90, 90d supply, fill #0

## 2021-01-06 MED ORDER — ATORVASTATIN CALCIUM 40 MG PO TABS
ORAL_TABLET | Freq: Every day | ORAL | 3 refills | Status: DC
  Filled 2021-01-06 – 2021-05-08 (×2): qty 90, 90d supply, fill #0
  Filled 2021-08-22: qty 90, 90d supply, fill #1
  Filled 2021-12-01: qty 90, 90d supply, fill #2

## 2021-01-06 MED ORDER — OMEPRAZOLE 20 MG PO CPDR
20.0000 mg | DELAYED_RELEASE_CAPSULE | Freq: Two times a day (BID) | ORAL | 0 refills | Status: AC
  Filled 2021-01-06: qty 28, 14d supply, fill #0

## 2021-01-06 MED ORDER — METRONIDAZOLE 250 MG PO TABS
250.0000 mg | ORAL_TABLET | Freq: Four times a day (QID) | ORAL | 0 refills | Status: DC
  Filled 2021-01-06: qty 56, 14d supply, fill #0

## 2021-01-06 MED FILL — Lansoprazole Cap Delayed Release 30 MG: ORAL | 90 days supply | Qty: 90 | Fill #0 | Status: AC

## 2021-01-06 MED FILL — Montelukast Sodium Tab 10 MG (Base Equiv): ORAL | 90 days supply | Qty: 90 | Fill #0 | Status: AC

## 2021-01-06 NOTE — Progress Notes (Signed)
Patient: Troy Burgess MRN: [id number] DOB: [birth year] PCP: Orma Flaming, MD     Subjective:  Chief Complaint  Patient presents with  . Hypertension  . Anxiety  . Prediabetes    HPI: The patient is a 45 y.o. male who presents today for HTN, Pre DM, Anxiety.  Hypertension: Here for follow up of hypertension.  Currently on cozaar 50mg /day . Takes medication as prescribed and denies any side effects. Exercise includes walking. Weight has been stable. Denies any chest pain, headaches, shortness of breath, vision changes, swelling in lower extremities. He had an elevated reading last time I saw him 6 months ago. He is working on weight loss and exercise. He has lost about 7-8 pounds.   Anxiety Currently on lexapro 10mg /daily. He is doing really well. 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   Prediabetes We checked his a1c at his last visit and it was 5.7.   Has plantar fascitis and will give him some exercises.   Review of Systems  Constitutional: Negative for chills, fatigue and fever.  HENT: Negative for congestion, dental problem, ear pain, hearing loss and trouble swallowing.   Eyes: Negative for visual disturbance.  Respiratory: Negative for cough, chest tightness and shortness of breath.   Cardiovascular: Negative for chest pain, palpitations and leg swelling.  Gastrointestinal: Negative for abdominal pain, blood in stool, diarrhea and nausea.  Endocrine: Negative for cold intolerance, polydipsia, polyphagia and polyuria.  Genitourinary: Negative for dysuria and hematuria.  Musculoskeletal: Negative for arthralgias.  Skin: Negative for rash.  Neurological: Negative for dizziness and headaches.  Psychiatric/Behavioral: Negative for dysphoric mood and sleep disturbance. The patient is not nervous/anxious.     Allergies Patient is allergic to amlodipine, bupropion, ciprofloxacin, codeine, hydrocodone-acetaminophen, lisinopril, and  nortriptyline.  Past Medical History Patient  has a past medical history of Elevated cholesterol and High blood pressure.  Surgical History Patient  has a past surgical history that includes No past surgeries; Colonoscopy; Colonoscopy (11/08/2020); Upper gastrointestinal endoscopy; and Upper gastrointestinal endoscopy (11/08/2020).  Family History Pateint's family history includes Hypertension in his father and mother.  Social History Patient  reports that he has quit smoking. His smoking use included cigarettes. He has never used smokeless tobacco. He reports current alcohol use of about 2.0 standard drinks of alcohol per week. He reports that he does not use drugs.    Objective: Vitals:   01/06/21 1302 01/06/21 1347  BP: 138/88 120/84  Pulse: (!) 110   Temp: 98.8 F (37.1 C)   TempSrc: Temporal   SpO2: 96%   Weight: 205 lb 9.6 oz (93.3 kg)   Height: 5\' 8"  (1.727 m)     Body mass index is 31.26 kg/m.  Physical Exam Vitals reviewed.  Constitutional:      Appearance: He is well-developed.  HENT:     Right Ear: External ear normal.     Left Ear: External ear normal.  Eyes:     Conjunctiva/sclera: Conjunctivae normal.     Pupils: Pupils are equal, round, and reactive to light.  Neck:     Thyroid: No thyromegaly.  Cardiovascular:     Rate and Rhythm: Normal rate and regular rhythm.     Heart sounds: Normal heart sounds. No murmur heard.   Pulmonary:     Effort: Pulmonary effort is normal.     Breath sounds: Normal breath sounds.  Abdominal:     General: Bowel sounds are normal. There is no distension.  Palpations: Abdomen is soft.     Tenderness: There is no abdominal tenderness.  Musculoskeletal:     Cervical back: Normal range of motion and neck supple.  Lymphadenopathy:     Cervical: No cervical adenopathy.  Skin:    General: Skin is warm and dry.     Findings: No rash.  Neurological:     Mental Status: He is alert and oriented to person, place, and  time.     Cranial Nerves: No cranial nerve deficit.     Coordination: Coordination normal.     Deep Tendon Reflexes: Reflexes normal.  Psychiatric:        Behavior: Behavior normal.    GAD 7 : Generalized Anxiety Score 01/06/2021 07/22/2020  Nervous, Anxious, on Edge 1 0  Control/stop worrying 0 0  Worry too much - different things 0 0  Trouble relaxing 1 1  Restless 0 1  Easily annoyed or irritable 0 0  Afraid - awful might happen 0 0  Total GAD 7 Score 2 2  Anxiety Difficulty Not difficult at all Not difficult at all          Assessment/plan: 1. Hypertension, essential Blood pressure is to goal. Continue current anti-hypertensive medications. Refills given and routine lab work will be done today. Recommended routine exercise and healthy diet including DASH diet and mediterranean diet. Encouraged weight loss. F/u in 6 months.    2. Anxiety GAD7 score of 2. very well controlled on his lexapro. Continue current dosing. F/u in 6 months.   3. Pre-diabetes a1c at 5.8 last check 6 months ago. Repeat labs in 6 months at annual. Has already lost 8 pounds and continues to implement changes in diet.   4. Plantar fasciitis Handout given on exercises to do at home, correct supportive shoes. Also recommended voltaren gel.   -recommended he call his urologist in regards to any changes in cialis.   -never took his h.pylori treatment. Will send back in for him again per GI doc protocol. Discussed importance of completing course of medication and 6+ week TOC.    Return in about 6 months (around 07/08/2021) for annual/fasting labs .   Orma Flaming, MD [facility]   01/06/2021

## 2021-01-10 ENCOUNTER — Other Ambulatory Visit (HOSPITAL_COMMUNITY): Payer: Self-pay

## 2021-01-18 ENCOUNTER — Other Ambulatory Visit: Payer: Self-pay

## 2021-01-18 ENCOUNTER — Other Ambulatory Visit: Payer: Self-pay | Admitting: Family Medicine

## 2021-01-18 ENCOUNTER — Other Ambulatory Visit (HOSPITAL_COMMUNITY): Payer: Self-pay

## 2021-01-18 MED ORDER — ZOLPIDEM TARTRATE 10 MG PO TABS
ORAL_TABLET | Freq: Every evening | ORAL | 0 refills | Status: DC | PRN
  Filled 2021-01-18: qty 90, fill #0

## 2021-01-18 MED FILL — Tadalafil Tab 5 MG: ORAL | 30 days supply | Qty: 30 | Fill #0 | Status: AC

## 2021-01-18 NOTE — Telephone Encounter (Signed)
Pt requesting Zolpidem (Ambien) 10 mg LOV: 01/06/2021 Next Visit: 10/100/2022  Please Advise.

## 2021-01-20 ENCOUNTER — Other Ambulatory Visit: Payer: Self-pay | Admitting: Family Medicine

## 2021-01-20 ENCOUNTER — Other Ambulatory Visit (HOSPITAL_COMMUNITY): Payer: Self-pay

## 2021-01-20 MED ORDER — VYVANSE 70 MG PO CAPS
ORAL_CAPSULE | ORAL | 0 refills | Status: DC
  Filled 2021-01-20: qty 30, 30d supply, fill #0

## 2021-01-26 ENCOUNTER — Other Ambulatory Visit (HOSPITAL_COMMUNITY): Payer: Self-pay

## 2021-01-26 ENCOUNTER — Telehealth: Payer: Self-pay

## 2021-01-26 MED ORDER — ZOLPIDEM TARTRATE 10 MG PO TABS
ORAL_TABLET | Freq: Every evening | ORAL | 0 refills | Status: DC | PRN
  Filled 2021-01-26: qty 90, fill #0
  Filled 2021-01-27 – 2021-02-20 (×2): qty 90, 90d supply, fill #0

## 2021-01-26 NOTE — Telephone Encounter (Signed)
  LAST APPOINTMENT DATE: 01/20/2021   NEXT APPOINTMENT DATE:@10 /07/2021  MEDICATION:zolpidem (AMBIEN) 10 MG tablet  PHARMACY:[facility]  Comments: Patient is requesting an early refill as he is traveling out of state and will be out of medication. [place] is leaving tomorrow morning.    Please advise

## 2021-01-27 ENCOUNTER — Other Ambulatory Visit (HOSPITAL_COMMUNITY): Payer: Self-pay

## 2021-01-27 NOTE — Telephone Encounter (Signed)
Patient is callign again requesting

## 2021-01-27 NOTE — Telephone Encounter (Signed)
I sent in yesterday.  Orma Flaming, MD [place]

## 2021-01-27 NOTE — Telephone Encounter (Signed)
I spoke with pt to confirm message below. He says that he is going out of the country and is requesting a week early. Pt needs to follow up with the pharmacy.

## 2021-02-20 ENCOUNTER — Other Ambulatory Visit (HOSPITAL_COMMUNITY): Payer: Self-pay

## 2021-02-20 ENCOUNTER — Other Ambulatory Visit: Payer: Self-pay | Admitting: Psychiatry

## 2021-02-20 ENCOUNTER — Other Ambulatory Visit: Payer: Self-pay | Admitting: Family Medicine

## 2021-02-20 DIAGNOSIS — F5105 Insomnia due to other mental disorder: Secondary | ICD-10-CM

## 2021-02-20 MED ORDER — VYVANSE 70 MG PO CAPS
ORAL_CAPSULE | ORAL | 0 refills | Status: DC
  Filled 2021-02-20: qty 30, 30d supply, fill #0

## 2021-02-20 MED ORDER — ESCITALOPRAM OXALATE 10 MG PO TABS
ORAL_TABLET | Freq: Every day | ORAL | 1 refills | Status: DC
  Filled 2021-02-20: qty 90, 90d supply, fill #0
  Filled 2021-07-14: qty 90, 90d supply, fill #1

## 2021-02-20 MED ORDER — TRAZODONE HCL 50 MG PO TABS
ORAL_TABLET | Freq: Every evening | ORAL | 0 refills | Status: DC | PRN
  Filled 2021-02-20: qty 60, 30d supply, fill #0

## 2021-02-20 MED ORDER — FLOVENT HFA 220 MCG/ACT IN AERO
1.0000 | INHALATION_SPRAY | Freq: Two times a day (BID) | RESPIRATORY_TRACT | 1 refills | Status: DC
  Filled 2021-02-20: qty 12, 60d supply, fill #0

## 2021-02-20 MED FILL — Tadalafil Tab 5 MG: ORAL | 30 days supply | Qty: 30 | Fill #1 | Status: AC

## 2021-02-28 ENCOUNTER — Other Ambulatory Visit (HOSPITAL_COMMUNITY): Payer: Self-pay

## 2021-02-28 MED ORDER — ANASTROZOLE 1 MG PO TABS
ORAL_TABLET | ORAL | 1 refills | Status: DC
  Filled 2021-02-28: qty 22, 88d supply, fill #0
  Filled 2021-05-24: qty 12, 50d supply, fill #1
  Filled 2021-05-24: qty 10, 40d supply, fill #1

## 2021-02-28 MED ORDER — CLOMIPHENE CITRATE 50 MG PO TABS
ORAL_TABLET | ORAL | 1 refills | Status: DC
  Filled 2021-02-28: qty 90, 90d supply, fill #0
  Filled 2021-05-24: qty 90, 90d supply, fill #1

## 2021-03-01 ENCOUNTER — Other Ambulatory Visit (HOSPITAL_COMMUNITY): Payer: Self-pay

## 2021-03-02 ENCOUNTER — Other Ambulatory Visit (HOSPITAL_COMMUNITY): Payer: Self-pay

## 2021-03-03 ENCOUNTER — Other Ambulatory Visit (HOSPITAL_COMMUNITY): Payer: Self-pay

## 2021-03-17 ENCOUNTER — Other Ambulatory Visit (HOSPITAL_COMMUNITY): Payer: Self-pay

## 2021-03-17 MED ORDER — VYVANSE 70 MG PO CAPS
ORAL_CAPSULE | ORAL | 0 refills | Status: DC
  Filled 2021-03-20: qty 30, 30d supply, fill #0

## 2021-03-20 ENCOUNTER — Other Ambulatory Visit (HOSPITAL_COMMUNITY): Payer: Self-pay

## 2021-03-20 MED FILL — Tadalafil Tab 5 MG: ORAL | 30 days supply | Qty: 30 | Fill #2 | Status: AC

## 2021-04-14 ENCOUNTER — Other Ambulatory Visit (HOSPITAL_COMMUNITY): Payer: Self-pay

## 2021-04-14 MED ORDER — VYVANSE 70 MG PO CAPS
70.0000 mg | ORAL_CAPSULE | Freq: Every day | ORAL | 0 refills | Status: DC
  Filled 2021-04-14 – 2021-04-18 (×2): qty 30, 30d supply, fill #0

## 2021-04-18 ENCOUNTER — Other Ambulatory Visit (HOSPITAL_COMMUNITY): Payer: Self-pay

## 2021-04-18 MED FILL — Montelukast Sodium Tab 10 MG (Base Equiv): ORAL | 90 days supply | Qty: 90 | Fill #0 | Status: AC

## 2021-04-19 ENCOUNTER — Other Ambulatory Visit (HOSPITAL_COMMUNITY): Payer: Self-pay

## 2021-05-08 ENCOUNTER — Other Ambulatory Visit: Payer: Self-pay | Admitting: Family Medicine

## 2021-05-08 ENCOUNTER — Other Ambulatory Visit (HOSPITAL_COMMUNITY): Payer: Self-pay

## 2021-05-08 MED FILL — Lansoprazole Cap Delayed Release 30 MG: ORAL | 58 days supply | Qty: 58 | Fill #0 | Status: AC

## 2021-05-08 MED FILL — Tadalafil Tab 5 MG: ORAL | 30 days supply | Qty: 30 | Fill #0 | Status: AC

## 2021-05-08 MED FILL — Lansoprazole Cap Delayed Release 30 MG: ORAL | 32 days supply | Qty: 32 | Fill #0 | Status: AC

## 2021-05-08 NOTE — Telephone Encounter (Signed)
Pt is scheduled to see you on 07/10/2021 for TOC.

## 2021-05-09 ENCOUNTER — Other Ambulatory Visit (HOSPITAL_COMMUNITY): Payer: Self-pay

## 2021-05-09 MED ORDER — ZOLPIDEM TARTRATE 10 MG PO TABS
10.0000 mg | ORAL_TABLET | Freq: Every evening | ORAL | 0 refills | Status: DC | PRN
Start: 2021-05-09 — End: 2021-09-01
  Filled 2021-05-09: qty 90, 90d supply, fill #0
  Filled 2021-05-09: qty 90, fill #0
  Filled 2021-05-29: qty 90, 90d supply, fill #0

## 2021-05-22 ENCOUNTER — Other Ambulatory Visit (HOSPITAL_COMMUNITY): Payer: Self-pay

## 2021-05-22 MED ORDER — VYVANSE 70 MG PO CAPS
70.0000 mg | ORAL_CAPSULE | Freq: Every day | ORAL | 0 refills | Status: DC
  Filled 2021-05-22: qty 30, 30d supply, fill #0

## 2021-05-24 ENCOUNTER — Other Ambulatory Visit (HOSPITAL_COMMUNITY): Payer: Self-pay

## 2021-05-29 ENCOUNTER — Other Ambulatory Visit (HOSPITAL_COMMUNITY): Payer: Self-pay

## 2021-06-07 ENCOUNTER — Other Ambulatory Visit (HOSPITAL_COMMUNITY): Payer: Self-pay

## 2021-06-07 MED ORDER — JORNAY PM 80 MG PO CP24
80.0000 mg | ORAL_CAPSULE | Freq: Every evening | ORAL | 0 refills | Status: DC
  Filled 2021-06-07 – 2021-06-13 (×2): qty 30, 30d supply, fill #0

## 2021-06-12 MED FILL — Tadalafil Tab 5 MG: ORAL | 30 days supply | Qty: 30 | Fill #1 | Status: AC

## 2021-06-13 ENCOUNTER — Other Ambulatory Visit (HOSPITAL_COMMUNITY): Payer: Self-pay

## 2021-06-21 ENCOUNTER — Other Ambulatory Visit (HOSPITAL_COMMUNITY): Payer: Self-pay

## 2021-06-21 MED ORDER — VYVANSE 70 MG PO CAPS
70.0000 mg | ORAL_CAPSULE | Freq: Every day | ORAL | 0 refills | Status: DC
  Filled 2021-06-21: qty 30, 30d supply, fill #0

## 2021-06-22 ENCOUNTER — Other Ambulatory Visit (HOSPITAL_COMMUNITY): Payer: Self-pay

## 2021-07-10 ENCOUNTER — Encounter: Payer: No Typology Code available for payment source | Admitting: Physician Assistant

## 2021-07-14 MED FILL — Tadalafil Tab 5 MG: ORAL | 30 days supply | Qty: 30 | Fill #2 | Status: AC

## 2021-07-17 ENCOUNTER — Other Ambulatory Visit (HOSPITAL_COMMUNITY): Payer: Self-pay

## 2021-07-24 ENCOUNTER — Other Ambulatory Visit (HOSPITAL_COMMUNITY): Payer: Self-pay

## 2021-07-24 MED ORDER — VYVANSE 70 MG PO CAPS
70.0000 mg | ORAL_CAPSULE | Freq: Every day | ORAL | 0 refills | Status: DC
  Filled 2021-07-24: qty 30, 30d supply, fill #0

## 2021-07-28 ENCOUNTER — Other Ambulatory Visit: Payer: Self-pay | Admitting: Family Medicine

## 2021-07-28 ENCOUNTER — Other Ambulatory Visit: Payer: Self-pay

## 2021-07-28 ENCOUNTER — Other Ambulatory Visit (HOSPITAL_COMMUNITY): Payer: Self-pay

## 2021-07-28 MED ORDER — MONTELUKAST SODIUM 10 MG PO TABS
10.0000 mg | ORAL_TABLET | Freq: Every day | ORAL | 0 refills | Status: DC
  Filled 2021-07-28: qty 90, 90d supply, fill #0

## 2021-08-22 ENCOUNTER — Other Ambulatory Visit: Payer: Self-pay

## 2021-08-22 ENCOUNTER — Other Ambulatory Visit: Payer: Self-pay | Admitting: Physician Assistant

## 2021-08-22 ENCOUNTER — Other Ambulatory Visit: Payer: Self-pay | Admitting: Family Medicine

## 2021-08-22 ENCOUNTER — Other Ambulatory Visit (HOSPITAL_COMMUNITY): Payer: Self-pay

## 2021-08-22 MED ORDER — LOSARTAN POTASSIUM 50 MG PO TABS
50.0000 mg | ORAL_TABLET | Freq: Every day | ORAL | 0 refills | Status: DC
  Filled 2021-08-22: qty 90, 90d supply, fill #0

## 2021-08-22 MED FILL — Tadalafil Tab 5 MG: ORAL | 30 days supply | Qty: 30 | Fill #3 | Status: AC

## 2021-08-22 MED FILL — Lansoprazole Cap Delayed Release 30 MG: ORAL | 90 days supply | Qty: 90 | Fill #1 | Status: AC

## 2021-08-23 ENCOUNTER — Other Ambulatory Visit (HOSPITAL_COMMUNITY): Payer: Self-pay

## 2021-08-23 ENCOUNTER — Telehealth: Payer: Self-pay

## 2021-08-23 ENCOUNTER — Other Ambulatory Visit: Payer: Self-pay

## 2021-08-23 MED ORDER — MONTELUKAST SODIUM 10 MG PO TABS
10.0000 mg | ORAL_TABLET | Freq: Every day | ORAL | 0 refills | Status: DC
  Filled 2021-08-23 – 2021-12-01 (×3): qty 90, 90d supply, fill #0

## 2021-08-23 MED ORDER — VYVANSE 70 MG PO CAPS
70.0000 mg | ORAL_CAPSULE | Freq: Every day | ORAL | 0 refills | Status: DC
  Filled 2021-08-23: qty 30, 30d supply, fill #0

## 2021-08-23 NOTE — Telephone Encounter (Signed)
LAST APPOINTMENT DATE:  01/06/21  NEXT APPOINTMENT DATE:  10/03/21 TOC from [place]: Ambien  PHARMACY: [facility] [address] [place], [place], [place] [address]

## 2021-08-28 ENCOUNTER — Other Ambulatory Visit: Payer: Self-pay | Admitting: Physician Assistant

## 2021-08-28 NOTE — Telephone Encounter (Signed)
I need to see him sooner than January to refill this medication, please.

## 2021-08-28 NOTE — Telephone Encounter (Signed)
Patient has appointment scheduled for 10/03/2021. Please advise.

## 2021-08-29 ENCOUNTER — Other Ambulatory Visit (HOSPITAL_COMMUNITY): Payer: Self-pay

## 2021-08-30 NOTE — Telephone Encounter (Signed)
Left voice message for patient to call clinic.  

## 2021-08-30 NOTE — Telephone Encounter (Signed)
Pt wants to know if Alyssa will give a few days worth. Pt is scheduled for Friday.

## 2021-09-01 ENCOUNTER — Ambulatory Visit (INDEPENDENT_AMBULATORY_CARE_PROVIDER_SITE_OTHER): Payer: No Typology Code available for payment source | Admitting: Physician Assistant

## 2021-09-01 ENCOUNTER — Other Ambulatory Visit: Payer: Self-pay

## 2021-09-01 ENCOUNTER — Other Ambulatory Visit (HOSPITAL_COMMUNITY): Payer: Self-pay

## 2021-09-01 VITALS — BP 120/81 | HR 90 | Temp 98.2°F | Ht 68.0 in | Wt 211.2 lb

## 2021-09-01 DIAGNOSIS — F5104 Psychophysiologic insomnia: Secondary | ICD-10-CM

## 2021-09-01 MED ORDER — ZOLPIDEM TARTRATE 10 MG PO TABS
10.0000 mg | ORAL_TABLET | Freq: Every evening | ORAL | 0 refills | Status: DC | PRN
  Filled 2021-09-01: qty 90, 90d supply, fill #0

## 2021-09-01 NOTE — Patient Instructions (Signed)
Good to meet you today! Your Ambien was refilled. Take only as directed.  See you back as scheduled in January.   Sleep hygiene still important.  Please work on the following to help with sleep:  -Sleep only long enough to feel rested then get out of bed -Go to bed and get up at the same time every day. -Do not try to force yourself to sleep. If you can't sleep, get out of bed adn try again later. -Have coffee, tea, and other foods that have caffeine only in the morning. -Avoid alcohol -Keep your bedroom dark, cool, quiet, and free of reminders of work or other things that cause you stress -Exercise several days a week, but not right before bed -Avoid looking at phones or reading devices ("e-books") that give off light before bed. This can make it harder to fall asleep

## 2021-09-01 NOTE — Progress Notes (Signed)
Subjective:    Patient ID: Troy Burgess, male    DOB: March 26, 1976, 45 y.o.   MRN: [id number]  Chief Complaint  Patient presents with   Medication Management    HPI Patient is in today for medication management.  He has been on Ambien for 10 years or more now. States that [place] and Seroquel gave him bad dreams. Trazodone gave hangover effect.  Usually has to take the Ambien most nights. Works as a Garment/textile technologist and travels all over, experiences jet-lag often.   No concerns. No symptoms.  Past Medical History:  Diagnosis Date   Elevated cholesterol    High blood pressure     Past Surgical History:  Procedure Laterality Date   COLONOSCOPY     COLONOSCOPY  11/08/2020   NO PAST SURGERIES     UPPER GASTROINTESTINAL ENDOSCOPY     UPPER GASTROINTESTINAL ENDOSCOPY  11/08/2020    Family History  Problem Relation Age of Onset   Hypertension Mother    Hypertension Father    Colon cancer Neg Hx    Esophageal cancer Neg Hx    Pancreatic cancer Neg Hx    Stomach cancer Neg Hx    Colon polyps Neg Hx    Rectal cancer Neg Hx     Social History   Tobacco Use   Smoking status: Former    Types: Cigarettes   Smokeless tobacco: Never  Vaping Use   Vaping Use: Never used  Substance Use Topics   Alcohol use: Yes    Alcohol/week: 2.0 standard drinks    Types: 2 Glasses of wine per week    Comment: 2 glasses of wine each day   Drug use: Never     Allergies  Allergen Reactions   Amlodipine Swelling    Pedal edema   Bupropion     Other reaction(s): Malaise (intolerance)   Ciprofloxacin     Other reaction(s): Myalgias (intolerance)   Codeine Nausea And Vomiting and Nausea Only   Hydrocodone-Acetaminophen Nausea And Vomiting   Lisinopril     Other reaction(s): Cough (ALLERGY/intolerance)   Nortriptyline     Other reaction(s): Other (See Comments) unknown    Review of Systems NEGATIVE UNLESS OTHERWISE INDICATED IN HPI      Objective:     BP 120/81   Pulse 90    Temp 98.2 F (36.8 C)   Ht 5\' 8"  (1.727 m)   Wt 211 lb 3.2 oz (95.8 kg)   SpO2 99%   BMI 32.11 kg/m   Wt Readings from Last 3 Encounters:  09/01/21 211 lb 3.2 oz (95.8 kg)  01/06/21 205 lb 9.6 oz (93.3 kg)  11/08/20 212 lb (96.2 kg)    BP Readings from Last 3 Encounters:  09/01/21 120/81  01/06/21 120/84  11/08/20 116/68     Physical Exam Vitals and nursing note reviewed.  Constitutional:      Appearance: Normal appearance.  HENT:     Head: Normocephalic.     Right Ear: External ear normal.     Left Ear: External ear normal.     Nose: Nose normal.  Cardiovascular:     Rate and Rhythm: Normal rate and regular rhythm.  Pulmonary:     Effort: Pulmonary effort is normal.     Breath sounds: Normal breath sounds.  Neurological:     Mental Status: He is alert.  Psychiatric:        Mood and Affect: Mood normal.        Behavior: Behavior  normal.       Assessment & Plan:   Problem List Items Addressed This Visit   None Visit Diagnoses     Chronic insomnia    -  Primary        Meds ordered this encounter  Medications   zolpidem (AMBIEN) 10 MG tablet    Sig: Take 1 tablet (10 mg total) by mouth nightly as needed for sleep.    Dispense:  90 tablet    Refill:  0    1. Chronic insomnia -PDMP reviewed today, no red flags, filling appropriately.  -Rx Ambien 10 mg qhs refilled #90 -F/up with me in January as scheduled -He knows to call sooner if any concerns    Jeremy Ditullio M Joydan Gretzinger, PA-C

## 2021-09-18 ENCOUNTER — Other Ambulatory Visit (HOSPITAL_COMMUNITY): Payer: Self-pay

## 2021-09-18 MED ORDER — VYVANSE 70 MG PO CAPS
70.0000 mg | ORAL_CAPSULE | Freq: Every day | ORAL | 0 refills | Status: DC
  Filled 2021-09-18: qty 30, 30d supply, fill #0

## 2021-09-21 ENCOUNTER — Other Ambulatory Visit (HOSPITAL_COMMUNITY): Payer: Self-pay

## 2021-09-21 MED ORDER — VYVANSE 70 MG PO CAPS
70.0000 mg | ORAL_CAPSULE | Freq: Every day | ORAL | 0 refills | Status: DC
  Filled 2021-09-21: qty 90, 90d supply, fill #0

## 2021-10-03 ENCOUNTER — Encounter: Payer: Self-pay | Admitting: Physician Assistant

## 2021-10-03 ENCOUNTER — Ambulatory Visit (INDEPENDENT_AMBULATORY_CARE_PROVIDER_SITE_OTHER): Payer: No Typology Code available for payment source | Admitting: Physician Assistant

## 2021-10-03 ENCOUNTER — Encounter: Payer: No Typology Code available for payment source | Admitting: Physician Assistant

## 2021-10-03 ENCOUNTER — Other Ambulatory Visit (HOSPITAL_COMMUNITY): Payer: Self-pay

## 2021-10-03 ENCOUNTER — Other Ambulatory Visit: Payer: Self-pay

## 2021-10-03 VITALS — BP 109/78 | HR 100 | Temp 98.4°F | Ht 68.0 in | Wt 212.2 lb

## 2021-10-03 DIAGNOSIS — R509 Fever, unspecified: Secondary | ICD-10-CM

## 2021-10-03 DIAGNOSIS — J45901 Unspecified asthma with (acute) exacerbation: Secondary | ICD-10-CM | POA: Diagnosis not present

## 2021-10-03 DIAGNOSIS — R051 Acute cough: Secondary | ICD-10-CM | POA: Diagnosis not present

## 2021-10-03 MED ORDER — BENZONATATE 100 MG PO CAPS
100.0000 mg | ORAL_CAPSULE | Freq: Three times a day (TID) | ORAL | 0 refills | Status: DC | PRN
Start: 2021-10-03 — End: 2022-09-10
  Filled 2021-10-03: qty 30, 10d supply, fill #0

## 2021-10-03 MED ORDER — METHYLPREDNISOLONE 4 MG PO TBPK
ORAL_TABLET | ORAL | 0 refills | Status: DC
  Filled 2021-10-03: qty 21, 6d supply, fill #0

## 2021-10-03 MED ORDER — AZITHROMYCIN 250 MG PO TABS
ORAL_TABLET | ORAL | 0 refills | Status: AC
  Filled 2021-10-03: qty 6, 5d supply, fill #0

## 2021-10-03 MED ORDER — METHYLPREDNISOLONE ACETATE 80 MG/ML IJ SUSP
80.0000 mg | Freq: Once | INTRAMUSCULAR | Status: AC
  Administered 2021-10-03: 80 mg via INTRAMUSCULAR

## 2021-10-03 MED ORDER — CEFTRIAXONE SODIUM 1 G IJ SOLR
1.0000 g | Freq: Once | INTRAMUSCULAR | Status: AC
  Administered 2021-10-03: 1 g via INTRAMUSCULAR

## 2021-10-03 NOTE — Progress Notes (Signed)
Subjective:    Patient ID: Troy Burgess, male    DOB: 19-Mar-1976, 46 y.o.   MRN: [id number]  Chief Complaint  Patient presents with   Cough    Fever,phlem,cough    HPI 46 yo male patient presents today with:  Chief complaint: Cough Symptom onset: 5 days ago started with cough, wheezing, congestion, some SOB Pertinent positives: Fatigue, headache, sneezing, loss of appetite, fever yesterday 100 Tmax  Pertinent negatives: N/V/D Treatments tried: OTC cough medicine Vaccine status: Did not have flu vaccine Sick exposure: Unsure of sick contacts At home COVID-19 test was negative 2 days ago.   Hx of COVID-19 infections x 3; hx of asthma exacerbations  Past Medical History:  Diagnosis Date   Elevated cholesterol    High blood pressure     Past Surgical History:  Procedure Laterality Date   COLONOSCOPY     COLONOSCOPY  11/08/2020   NO PAST SURGERIES     UPPER GASTROINTESTINAL ENDOSCOPY     UPPER GASTROINTESTINAL ENDOSCOPY  11/08/2020    Family History  Problem Relation Age of Onset   Hypertension Mother    Hypertension Father    Colon cancer Neg Hx    Esophageal cancer Neg Hx    Pancreatic cancer Neg Hx    Stomach cancer Neg Hx    Colon polyps Neg Hx    Rectal cancer Neg Hx     Social History   Tobacco Use   Smoking status: Former    Types: Cigarettes   Smokeless tobacco: Never  Vaping Use   Vaping Use: Never used  Substance Use Topics   Alcohol use: Yes    Alcohol/week: 2.0 standard drinks    Types: 2 Glasses of wine per week    Comment: 2 glasses of wine each day   Drug use: Never     Allergies  Allergen Reactions   Amlodipine Swelling    Pedal edema   Bupropion     Other reaction(s): Malaise (intolerance)   Ciprofloxacin     Other reaction(s): Myalgias (intolerance)   Codeine Nausea And Vomiting and Nausea Only   Hydrocodone-Acetaminophen Nausea And Vomiting   Lisinopril     Other reaction(s): Cough (ALLERGY/intolerance)   Nortriptyline      Other reaction(s): Other (See Comments) unknown    Review of Systems NEGATIVE UNLESS OTHERWISE INDICATED IN HPI      Objective:     BP 109/78    Pulse 100    Temp 98.4 F (36.9 C)    Ht 5\' 8"  (1.727 m)    Wt 212 lb 3.2 oz (96.3 kg)    SpO2 96%    BMI 32.26 kg/m   Wt Readings from Last 3 Encounters:  10/03/21 212 lb 3.2 oz (96.3 kg)  09/01/21 211 lb 3.2 oz (95.8 kg)  01/06/21 205 lb 9.6 oz (93.3 kg)    BP Readings from Last 3 Encounters:  10/03/21 109/78  09/01/21 120/81  01/06/21 120/84     Physical Exam Vitals and nursing note reviewed.  Constitutional:      General: He is not in acute distress.    Appearance: Normal appearance. He is not toxic-appearing.  HENT:     Head: Normocephalic and atraumatic.     Right Ear: Ear canal and external ear normal. A middle ear effusion is present.     Left Ear: Ear canal and external ear normal. A middle ear effusion is present.     Nose: Congestion present.  Right Turbinates: Enlarged.     Left Turbinates: Enlarged.     Mouth/Throat:     Mouth: Mucous membranes are moist.     Pharynx: Oropharynx is clear.  Eyes:     Extraocular Movements: Extraocular movements intact.     Conjunctiva/sclera: Conjunctivae normal.     Pupils: Pupils are equal, round, and reactive to light.  Cardiovascular:     Rate and Rhythm: Normal rate and regular rhythm.     Pulses: Normal pulses.     Heart sounds: Normal heart sounds.  Pulmonary:     Effort: Pulmonary effort is normal.     Breath sounds: Wheezing (diffuse) and rhonchi (diffuse) present. No decreased breath sounds.  Abdominal:     General: Abdomen is flat. Bowel sounds are normal.     Palpations: Abdomen is soft.     Tenderness: There is no abdominal tenderness.  Musculoskeletal:        General: Normal range of motion.     Cervical back: Normal range of motion and neck supple.  Skin:    General: Skin is warm and dry.  Neurological:     General: No focal deficit present.      Mental Status: He is alert and oriented to person, place, and time.  Psychiatric:        Mood and Affect: Mood normal.        Behavior: Behavior normal.       Assessment & Plan:   Problem List Items Addressed This Visit   None Visit Diagnoses     Mild asthma exacerbation    -  Primary   Relevant Medications   methylPREDNISolone acetate (DEPO-MEDROL) injection 80 mg (Start on 10/03/2021 10:15 AM)   cefTRIAXone (ROCEPHIN) injection 1 g (Start on 10/03/2021 10:15 AM)   methylPREDNISolone (MEDROL DOSEPAK) 4 MG TBPK tablet   Fever, unspecified fever cause       Relevant Medications   methylPREDNISolone acetate (DEPO-MEDROL) injection 80 mg (Start on 10/03/2021 10:15 AM)   cefTRIAXone (ROCEPHIN) injection 1 g (Start on 10/03/2021 10:15 AM)   Acute cough       Relevant Medications   methylPREDNISolone acetate (DEPO-MEDROL) injection 80 mg (Start on 10/03/2021 10:15 AM)   cefTRIAXone (ROCEPHIN) injection 1 g (Start on 10/03/2021 10:15 AM)        Meds ordered this encounter  Medications   methylPREDNISolone acetate (DEPO-MEDROL) injection 80 mg   cefTRIAXone (ROCEPHIN) injection 1 g   azithromycin (ZITHROMAX) 250 MG tablet    Sig: Take 2 tablets on day 1, then 1 tablet daily on days 2 through 5    Dispense:  6 tablet    Refill:  0   methylPREDNISolone (MEDROL DOSEPAK) 4 MG TBPK tablet    Sig: Please take per packaging instructions.    Dispense:  21 tablet    Refill:  0   benzonatate (TESSALON PERLES) 100 MG capsule    Sig: Take 1 capsule (100 mg total) by mouth 3 (three) times daily as needed for cough.    Dispense:  30 capsule    Refill:  0   1. Mild asthma exacerbation 2. Fever, unspecified fever cause 3. Acute cough -I discussed with the patient at this time further testing for COVID-19, influenza, or other panels are probably not warranted as he is outside the 5-day onset window for antivirals and this does not change my treatment plan. -With history of asthma exacerbations and  walking pneumonia, will be aggressive with treatment today including Z-pak, Rocephin  IM, depomedrol 80 mg IM. He may take Mucinex and tessalon perles at home. He needs to continue to rest and push fluids. -He also has flovent inhaler he may use twice daily.  -Recheck if worse or no improvement of symptoms.   Barbarita Hutmacher M Alexie Lanni, PA-C

## 2021-10-03 NOTE — Patient Instructions (Signed)
Take the Z-pak and steroid pack as directed. Push fluids, rest, practice deep breathing. Use rescue albuterol inhaler every 4 hours as needed. Flovent may be used twice daily (rinse mouth after use).

## 2021-10-17 ENCOUNTER — Encounter: Payer: No Typology Code available for payment source | Admitting: Physician Assistant

## 2021-10-20 ENCOUNTER — Encounter: Payer: No Typology Code available for payment source | Admitting: Physician Assistant

## 2021-10-24 ENCOUNTER — Other Ambulatory Visit (HOSPITAL_COMMUNITY): Payer: Self-pay

## 2021-10-24 ENCOUNTER — Other Ambulatory Visit: Payer: Self-pay

## 2021-10-24 MED ORDER — ANASTROZOLE 1 MG PO TABS
ORAL_TABLET | ORAL | 3 refills | Status: DC
  Filled 2021-10-24 (×2): qty 90, 90d supply, fill #0
  Filled 2022-03-20: qty 90, 90d supply, fill #1

## 2021-10-24 MED ORDER — SILDENAFIL CITRATE 100 MG PO TABS
ORAL_TABLET | ORAL | 3 refills | Status: DC
Start: 2021-10-24 — End: 2022-09-10
  Filled 2021-10-24: qty 20, 20d supply, fill #0
  Filled 2021-10-24: qty 18, 90d supply, fill #0
  Filled 2022-01-16: qty 18, 90d supply, fill #1
  Filled 2022-06-12: qty 18, 90d supply, fill #2

## 2021-10-24 MED ORDER — CLOMIPHENE CITRATE 50 MG PO TABS
ORAL_TABLET | ORAL | 3 refills | Status: DC
  Filled 2021-10-24 – 2022-01-18 (×5): qty 90, 90d supply, fill #0
  Filled 2022-05-21: qty 90, 90d supply, fill #1

## 2021-10-24 MED ORDER — TADALAFIL 5 MG PO TABS
ORAL_TABLET | ORAL | 3 refills | Status: DC
Start: 2021-10-24 — End: 2022-09-10
  Filled 2021-10-24 (×2): qty 30, 30d supply, fill #0
  Filled 2021-12-10: qty 30, 30d supply, fill #1
  Filled 2021-12-26 – 2022-03-02 (×4): qty 30, 30d supply, fill #2
  Filled 2022-03-20: qty 30, 30d supply, fill #3
  Filled 2022-05-21: qty 30, 30d supply, fill #4
  Filled 2022-07-05: qty 28, 28d supply, fill #5
  Filled 2022-07-06: qty 2, 2d supply, fill #5

## 2021-10-25 ENCOUNTER — Other Ambulatory Visit: Payer: Self-pay

## 2021-10-25 ENCOUNTER — Other Ambulatory Visit (HOSPITAL_COMMUNITY): Payer: Self-pay

## 2021-10-25 ENCOUNTER — Ambulatory Visit (INDEPENDENT_AMBULATORY_CARE_PROVIDER_SITE_OTHER): Payer: No Typology Code available for payment source | Admitting: Physician Assistant

## 2021-10-25 ENCOUNTER — Encounter: Payer: Self-pay | Admitting: Physician Assistant

## 2021-10-25 VITALS — BP 100/60 | HR 82 | Temp 98.4°F | Wt 208.6 lb

## 2021-10-25 DIAGNOSIS — E782 Mixed hyperlipidemia: Secondary | ICD-10-CM

## 2021-10-25 DIAGNOSIS — I10 Essential (primary) hypertension: Secondary | ICD-10-CM

## 2021-10-25 DIAGNOSIS — Z0000 Encounter for general adult medical examination without abnormal findings: Secondary | ICD-10-CM | POA: Diagnosis not present

## 2021-10-25 DIAGNOSIS — R7303 Prediabetes: Secondary | ICD-10-CM

## 2021-10-25 DIAGNOSIS — E611 Iron deficiency: Secondary | ICD-10-CM | POA: Diagnosis not present

## 2021-10-25 LAB — IBC + FERRITIN
Ferritin: 85.3 ng/mL (ref 22.0–322.0)
Iron: 74 ug/dL (ref 42–165)
Saturation Ratios: 24 % (ref 20.0–50.0)
TIBC: 308 ug/dL (ref 250.0–450.0)
Transferrin: 220 mg/dL (ref 212.0–360.0)

## 2021-10-25 LAB — COMPREHENSIVE METABOLIC PANEL
ALT: 28 U/L (ref 0–53)
AST: 20 U/L (ref 0–37)
Albumin: 4.2 g/dL (ref 3.5–5.2)
Alkaline Phosphatase: 74 U/L (ref 39–117)
BUN: 17 mg/dL (ref 6–23)
CO2: 30 mEq/L (ref 19–32)
Calcium: 9.7 mg/dL (ref 8.4–10.5)
Chloride: 103 mEq/L (ref 96–112)
Creatinine, Ser: 0.93 mg/dL (ref 0.40–1.50)
GFR: 97.73 mL/min (ref >60.00)
Glucose, Bld: 96 mg/dL (ref 70–99)
Potassium: 4.6 mEq/L (ref 3.5–5.1)
Sodium: 138 mEq/L (ref 135–145)
Total Bilirubin: 0.6 mg/dL (ref 0.2–1.2)
Total Protein: 6.9 g/dL (ref 6.0–8.3)

## 2021-10-25 LAB — LIPID PANEL
Cholesterol: 157 mg/dL (ref 0–200)
HDL: 34.8 mg/dL — ABNORMAL LOW (ref >39.00)
LDL Cholesterol: 107 mg/dL — ABNORMAL HIGH (ref 0–99)
NonHDL: 122.52
Total CHOL/HDL Ratio: 5
Triglycerides: 79 mg/dL (ref 0.0–149.0)
VLDL: 15.8 mg/dL (ref 0.0–40.0)

## 2021-10-25 LAB — CBC WITH DIFFERENTIAL/PLATELET
Basophils Absolute: 0.1 10*3/uL (ref 0.0–0.1)
Basophils Relative: 0.8 % (ref 0.0–3.0)
Eosinophils Absolute: 0.2 10*3/uL (ref 0.0–0.7)
Eosinophils Relative: 3.1 % (ref 0.0–5.0)
HCT: 48 % (ref 39.0–52.0)
Hemoglobin: 15.6 g/dL (ref 13.0–17.0)
Lymphocytes Relative: 25.5 % (ref 12.0–46.0)
Lymphs Abs: 1.8 10*3/uL (ref 0.7–4.0)
MCHC: 32.6 g/dL (ref 30.0–36.0)
MCV: 88.7 fl (ref 78.0–100.0)
Monocytes Absolute: 0.7 10*3/uL (ref 0.1–1.0)
Monocytes Relative: 9.8 % (ref 3.0–12.0)
Neutro Abs: 4.2 10*3/uL (ref 1.4–7.7)
Neutrophils Relative %: 60.8 % (ref 43.0–77.0)
Platelets: 253 10*3/uL (ref 150.0–400.0)
RBC: 5.41 Mil/uL (ref 4.22–5.81)
RDW: 14 % (ref 11.5–15.5)
WBC: 7 10*3/uL (ref 4.0–10.5)

## 2021-10-25 LAB — HEMOGLOBIN A1C: Hgb A1c MFr Bld: 5.5 % (ref 4.6–6.5)

## 2021-10-25 MED ORDER — MOUNJARO 2.5 MG/0.5ML ~~LOC~~ SOPN
2.5000 mg | PEN_INJECTOR | SUBCUTANEOUS | 0 refills | Status: AC
Start: 2021-10-25 — End: 2021-11-24
  Filled 2021-10-25 – 2021-12-15 (×3): qty 2, 28d supply, fill #0

## 2021-10-25 NOTE — Patient Instructions (Addendum)
Good to see you!! Please go to the lab for blood work and I will send results through [place].  We'll try to get [facility] approved!

## 2021-10-25 NOTE — Progress Notes (Signed)
Subjective:    Patient ID: Troy Burgess, male    DOB: 07-May-1976, 46 y.o.   MRN: [id number]  Chief Complaint  Patient presents with   Annual Exam    Wants to talk about starting mounjaro for weight loss    HPI Patient is in today for annual exam.  Acute concerns: Would like to try Mounjaro to help with weight loss  Health maintenance: Lifestyle/ exercise: Walks daily Nutrition: Quit fast food, working on nutrition at home  Mental health: Doing well, Vyvanse has helped with focus tremendously Sleep: Doing well, usually takes Ambien Substance use: None  ETOH: Wine every few weeks Immunizations: Needs to update Tdap Colonoscopy: UTD 11/08/2020    Past Medical History:  Diagnosis Date   Elevated cholesterol    High blood pressure     Past Surgical History:  Procedure Laterality Date   COLONOSCOPY     COLONOSCOPY  11/08/2020   NO PAST SURGERIES     UPPER GASTROINTESTINAL ENDOSCOPY     UPPER GASTROINTESTINAL ENDOSCOPY  11/08/2020    Family History  Problem Relation Age of Onset   Hypertension Mother    Hypertension Father    Colon cancer Neg Hx    Esophageal cancer Neg Hx    Pancreatic cancer Neg Hx    Stomach cancer Neg Hx    Colon polyps Neg Hx    Rectal cancer Neg Hx     Social History   Tobacco Use   Smoking status: Former    Types: Cigarettes   Smokeless tobacco: Never  Vaping Use   Vaping Use: Never used  Substance Use Topics   Alcohol use: Yes    Alcohol/week: 2.0 standard drinks    Types: 2 Glasses of wine per week    Comment: 2 glasses of wine each day   Drug use: Never     Allergies  Allergen Reactions   Amlodipine Swelling    Pedal edema   Bupropion     Other reaction(s): Malaise (intolerance)   Ciprofloxacin     Other reaction(s): Myalgias (intolerance)   Codeine Nausea And Vomiting and Nausea Only   Hydrocodone-Acetaminophen Nausea And Vomiting   Lisinopril     Other reaction(s): Cough (ALLERGY/intolerance)   Nortriptyline      Other reaction(s): Other (See Comments) unknown    Review of Systems NEGATIVE UNLESS OTHERWISE INDICATED IN HPI      Objective:     BP 100/60    Pulse 82    Temp 98.4 F (36.9 C)    Wt 208 lb 9.6 oz (94.6 kg)    SpO2 98%    BMI 31.72 kg/m   Wt Readings from Last 3 Encounters:  10/25/21 208 lb 9.6 oz (94.6 kg)  10/03/21 212 lb 3.2 oz (96.3 kg)  09/01/21 211 lb 3.2 oz (95.8 kg)    BP Readings from Last 3 Encounters:  10/25/21 100/60  10/03/21 109/78  09/01/21 120/81     Physical Exam Vitals and nursing note reviewed.  Constitutional:      General: He is not in acute distress.    Appearance: Normal appearance. He is not toxic-appearing.  HENT:     Head: Normocephalic and atraumatic.     Right Ear: Tympanic membrane, ear canal and external ear normal.     Left Ear: Tympanic membrane, ear canal and external ear normal.     Nose: Nose normal.     Mouth/Throat:     Mouth: Mucous membranes are moist.  Pharynx: Oropharynx is clear.  Eyes:     Extraocular Movements: Extraocular movements intact.     Conjunctiva/sclera: Conjunctivae normal.     Pupils: Pupils are equal, round, and reactive to light.  Cardiovascular:     Rate and Rhythm: Normal rate and regular rhythm.     Pulses: Normal pulses.     Heart sounds: Normal heart sounds.  Pulmonary:     Effort: Pulmonary effort is normal.     Breath sounds: Normal breath sounds.  Abdominal:     General: Abdomen is flat. Bowel sounds are normal.     Palpations: Abdomen is soft.     Tenderness: There is no abdominal tenderness.  Musculoskeletal:        General: Normal range of motion.     Cervical back: Normal range of motion and neck supple.  Skin:    General: Skin is warm and dry.  Neurological:     General: No focal deficit present.     Mental Status: He is alert and oriented to person, place, and time.  Psychiatric:        Mood and Affect: Mood normal.        Behavior: Behavior normal.       Assessment &  Plan:   Problem List Items Addressed This Visit       Cardiovascular and Mediastinum   Hypertension, essential     Other   Hyperlipidemia   Relevant Orders   Lipid panel   Pre-diabetes   Relevant Orders   Comprehensive metabolic panel   Hemoglobin A1c   Other Visit Diagnoses     Encounter for annual physical exam    -  Primary   Relevant Orders   CBC with Differential/Platelet   Comprehensive metabolic panel   Lipid panel   Hemoglobin A1c   Iron, TIBC and Ferritin Panel   Iron deficiency       Relevant Orders   CBC with Differential/Platelet   Iron, TIBC and Ferritin Panel        Meds ordered this encounter  Medications   tirzepatide (MOUNJARO) 2.5 MG/0.5ML Pen    Sig: Inject 2.5 mg into the skin once a week.    Dispense:  2 mL    Refill:  0   Plan: Age-appropriate screening and counseling performed today. Will check labs and call with results. Preventive measures discussed and printed in AVS for patient. BP is doing great, cont on Cozaar 50 mg daily. Pt would benefit from weight loss. With hx of prediabetes, will try to get [facility] approved for additional help. R/B/A discussed.  Plan to f/up in 6 months for med check or prn   Preet Mangano M Braheem Tomasik, PA-C

## 2021-10-25 NOTE — Addendum Note (Signed)
Addended by: Loura Back on: 10/25/2021 12:34 PM   Modules accepted: Orders

## 2021-10-25 NOTE — Addendum Note (Signed)
Addended by: Loura Back on: 10/25/2021 12:32 PM   Modules accepted: Orders

## 2021-10-31 ENCOUNTER — Other Ambulatory Visit (HOSPITAL_COMMUNITY): Payer: Self-pay

## 2021-11-03 ENCOUNTER — Other Ambulatory Visit (HOSPITAL_COMMUNITY): Payer: Self-pay

## 2021-11-28 ENCOUNTER — Other Ambulatory Visit (HOSPITAL_COMMUNITY): Payer: Self-pay

## 2021-11-28 ENCOUNTER — Other Ambulatory Visit: Payer: Self-pay | Admitting: Physician Assistant

## 2021-11-28 ENCOUNTER — Other Ambulatory Visit: Payer: Self-pay | Admitting: Urology

## 2021-11-29 ENCOUNTER — Other Ambulatory Visit (HOSPITAL_COMMUNITY): Payer: Self-pay

## 2021-11-29 ENCOUNTER — Other Ambulatory Visit: Payer: Self-pay | Admitting: Physician Assistant

## 2021-11-29 MED ORDER — LANSOPRAZOLE 30 MG PO CPDR
30.0000 mg | DELAYED_RELEASE_CAPSULE | Freq: Every morning | ORAL | 1 refills | Status: DC
  Filled 2021-11-29: qty 90, 90d supply, fill #0
  Filled 2022-03-20: qty 90, 90d supply, fill #1

## 2021-11-29 MED ORDER — ZOLPIDEM TARTRATE 10 MG PO TABS
10.0000 mg | ORAL_TABLET | Freq: Every evening | ORAL | 0 refills | Status: DC | PRN
  Filled 2021-11-29: qty 90, 90d supply, fill #0

## 2021-11-29 NOTE — Telephone Encounter (Signed)
Last OV: 10/25/21 dx. CPE ?Last refill: 09/01/21 #90, 0 ?

## 2021-12-01 ENCOUNTER — Other Ambulatory Visit (HOSPITAL_COMMUNITY): Payer: Self-pay

## 2021-12-01 ENCOUNTER — Other Ambulatory Visit: Payer: Self-pay | Admitting: Physician Assistant

## 2021-12-01 MED ORDER — LOSARTAN POTASSIUM 50 MG PO TABS
50.0000 mg | ORAL_TABLET | Freq: Every day | ORAL | 0 refills | Status: DC
  Filled 2021-12-01: qty 90, 90d supply, fill #0

## 2021-12-04 ENCOUNTER — Other Ambulatory Visit (HOSPITAL_COMMUNITY): Payer: Self-pay

## 2021-12-11 ENCOUNTER — Other Ambulatory Visit (HOSPITAL_COMMUNITY): Payer: Self-pay

## 2021-12-12 ENCOUNTER — Other Ambulatory Visit (HOSPITAL_COMMUNITY): Payer: Self-pay

## 2021-12-12 MED ORDER — VYVANSE 70 MG PO CAPS
70.0000 mg | ORAL_CAPSULE | Freq: Every day | ORAL | 0 refills | Status: DC
Start: 2021-12-12 — End: 2022-03-27
  Filled 2021-12-12 – 2021-12-25 (×2): qty 90, 90d supply, fill #0

## 2021-12-15 ENCOUNTER — Other Ambulatory Visit (HOSPITAL_COMMUNITY): Payer: Self-pay

## 2021-12-19 ENCOUNTER — Other Ambulatory Visit (HOSPITAL_COMMUNITY): Payer: Self-pay

## 2021-12-25 ENCOUNTER — Other Ambulatory Visit: Payer: Self-pay | Admitting: Urology

## 2021-12-25 ENCOUNTER — Other Ambulatory Visit (HOSPITAL_COMMUNITY): Payer: Self-pay

## 2021-12-26 ENCOUNTER — Other Ambulatory Visit (HOSPITAL_COMMUNITY): Payer: Self-pay

## 2021-12-27 ENCOUNTER — Other Ambulatory Visit (HOSPITAL_COMMUNITY): Payer: Self-pay

## 2022-01-05 ENCOUNTER — Other Ambulatory Visit (HOSPITAL_COMMUNITY): Payer: Self-pay

## 2022-01-08 ENCOUNTER — Other Ambulatory Visit (HOSPITAL_COMMUNITY): Payer: Self-pay

## 2022-01-16 ENCOUNTER — Other Ambulatory Visit: Payer: Self-pay | Admitting: Urology

## 2022-01-16 ENCOUNTER — Encounter: Payer: Self-pay | Admitting: Physician Assistant

## 2022-01-16 ENCOUNTER — Other Ambulatory Visit (HOSPITAL_COMMUNITY): Payer: Self-pay

## 2022-01-17 ENCOUNTER — Other Ambulatory Visit (HOSPITAL_COMMUNITY): Payer: Self-pay

## 2022-01-18 ENCOUNTER — Other Ambulatory Visit (HOSPITAL_COMMUNITY): Payer: Self-pay

## 2022-01-18 ENCOUNTER — Other Ambulatory Visit: Payer: Self-pay | Admitting: Physician Assistant

## 2022-01-18 DIAGNOSIS — R7303 Prediabetes: Secondary | ICD-10-CM

## 2022-01-18 DIAGNOSIS — E782 Mixed hyperlipidemia: Secondary | ICD-10-CM

## 2022-01-18 MED ORDER — WEGOVY 0.25 MG/0.5ML ~~LOC~~ SOAJ
0.2500 mg | SUBCUTANEOUS | 0 refills | Status: DC
  Filled 2022-01-18 – 2022-01-24 (×2): qty 2, 28d supply, fill #0

## 2022-01-24 ENCOUNTER — Other Ambulatory Visit (HOSPITAL_COMMUNITY): Payer: Self-pay

## 2022-02-02 ENCOUNTER — Other Ambulatory Visit (HOSPITAL_COMMUNITY): Payer: Self-pay

## 2022-02-05 ENCOUNTER — Other Ambulatory Visit: Payer: Self-pay | Admitting: Urology

## 2022-02-05 ENCOUNTER — Other Ambulatory Visit (HOSPITAL_COMMUNITY): Payer: Self-pay

## 2022-02-09 ENCOUNTER — Other Ambulatory Visit: Payer: Self-pay | Admitting: Urology

## 2022-02-09 ENCOUNTER — Other Ambulatory Visit (HOSPITAL_COMMUNITY): Payer: Self-pay

## 2022-02-14 ENCOUNTER — Other Ambulatory Visit (HOSPITAL_COMMUNITY): Payer: Self-pay

## 2022-02-14 ENCOUNTER — Other Ambulatory Visit: Payer: Self-pay | Admitting: Urology

## 2022-02-15 ENCOUNTER — Other Ambulatory Visit (HOSPITAL_COMMUNITY): Payer: Self-pay

## 2022-02-16 ENCOUNTER — Other Ambulatory Visit (HOSPITAL_COMMUNITY): Payer: Self-pay

## 2022-02-27 ENCOUNTER — Other Ambulatory Visit: Payer: Self-pay | Admitting: Physician Assistant

## 2022-02-27 ENCOUNTER — Other Ambulatory Visit (HOSPITAL_COMMUNITY): Payer: Self-pay

## 2022-02-27 MED ORDER — ZOLPIDEM TARTRATE 10 MG PO TABS
10.0000 mg | ORAL_TABLET | Freq: Every evening | ORAL | 0 refills | Status: DC | PRN
  Filled 2022-02-27 – 2022-03-01 (×2): qty 90, 90d supply, fill #0

## 2022-02-27 NOTE — Telephone Encounter (Signed)
Last visit: 10/25/21  Next visit: none scheduled  Last filled: 11/29/21  Quantity: 90 w/ 0 refills

## 2022-03-01 ENCOUNTER — Other Ambulatory Visit (HOSPITAL_COMMUNITY): Payer: Self-pay

## 2022-03-02 ENCOUNTER — Other Ambulatory Visit (HOSPITAL_COMMUNITY): Payer: Self-pay

## 2022-03-02 ENCOUNTER — Encounter (HOSPITAL_COMMUNITY): Payer: Self-pay

## 2022-03-02 ENCOUNTER — Other Ambulatory Visit: Payer: Self-pay

## 2022-03-02 ENCOUNTER — Emergency Department (HOSPITAL_COMMUNITY)
Admission: EM | Admit: 2022-03-02 | Discharge: 2022-03-03 | Disposition: A | Discharge status: Home / Self Care | Payer: No Typology Code available for payment source | Attending: Emergency Medicine | Admitting: Emergency Medicine

## 2022-03-02 DIAGNOSIS — D72829 Elevated white blood cell count, unspecified: Secondary | ICD-10-CM | POA: Diagnosis not present

## 2022-03-02 DIAGNOSIS — R1012 Left upper quadrant pain: Secondary | ICD-10-CM | POA: Diagnosis present

## 2022-03-02 DIAGNOSIS — N132 Hydronephrosis with renal and ureteral calculous obstruction: Secondary | ICD-10-CM | POA: Insufficient documentation

## 2022-03-02 DIAGNOSIS — N201 Calculus of ureter: Secondary | ICD-10-CM

## 2022-03-02 NOTE — ED Triage Notes (Signed)
Pt reports sudden onset of abd pain that radiates to his testicles x45 mins ago. Pt is crying in triage and seem very uncomfortable. Pt reports nausea.

## 2022-03-03 ENCOUNTER — Emergency Department (HOSPITAL_COMMUNITY): Payer: No Typology Code available for payment source

## 2022-03-03 LAB — CBC
HCT: 50.1 % (ref 39.0–52.0)
Hemoglobin: 16.8 g/dL (ref 13.0–17.0)
MCH: 29.2 pg (ref 26.0–34.0)
MCHC: 33.5 g/dL (ref 30.0–36.0)
MCV: 87 fL (ref 80.0–100.0)
Platelets: 326 10*3/uL (ref 150–400)
RBC: 5.76 MIL/uL (ref 4.22–5.81)
RDW: 13.2 % (ref 11.5–15.5)
WBC: 15 10*3/uL — ABNORMAL HIGH (ref 4.0–10.5)
nRBC: 0 % (ref 0.0–0.2)

## 2022-03-03 LAB — URINALYSIS, ROUTINE W REFLEX MICROSCOPIC
Bilirubin Urine: NEGATIVE
Glucose, UA: NEGATIVE mg/dL
Ketones, ur: NEGATIVE mg/dL
Leukocytes,Ua: NEGATIVE
Nitrite: NEGATIVE
Protein, ur: 30 mg/dL — AB
RBC / HPF: >50 RBC/hpf — ABNORMAL HIGH (ref 0–5)
Specific Gravity, Urine: 1.028 (ref 1.005–1.030)
pH: 5 (ref 5.0–8.0)

## 2022-03-03 LAB — COMPREHENSIVE METABOLIC PANEL
ALT: 28 U/L (ref 0–44)
AST: 30 U/L (ref 15–41)
Albumin: 4 g/dL (ref 3.5–5.0)
Alkaline Phosphatase: 81 U/L (ref 38–126)
Anion gap: 10 (ref 5–15)
BUN: 18 mg/dL (ref 6–20)
CO2: 22 mmol/L (ref 22–32)
Calcium: 10.3 mg/dL (ref 8.9–10.3)
Chloride: 106 mmol/L (ref 98–111)
Creatinine, Ser: 1.09 mg/dL (ref 0.61–1.24)
GFR, Estimated: >60 mL/min (ref >60)
Glucose, Bld: 113 mg/dL — ABNORMAL HIGH (ref 70–99)
Potassium: 3.9 mmol/L (ref 3.5–5.1)
Sodium: 138 mmol/L (ref 135–145)
Total Bilirubin: 0.6 mg/dL (ref 0.3–1.2)
Total Protein: 7.2 g/dL (ref 6.5–8.1)

## 2022-03-03 LAB — LIPASE, BLOOD: Lipase: 36 U/L (ref 11–51)

## 2022-03-03 MED ORDER — ONDANSETRON 8 MG PO TBDP: 8.0000 mg | ORAL_TABLET | Freq: Three times a day (TID) | ORAL | 0 refills | Status: DC | PRN

## 2022-03-03 MED ORDER — ONDANSETRON HCL 4 MG/2ML IJ SOLN
4.0000 mg | Freq: Once | INTRAMUSCULAR | Status: AC
  Administered 2022-03-03: 4 mg via INTRAVENOUS
  Filled 2022-03-03: qty 2

## 2022-03-03 MED ORDER — TAMSULOSIN HCL 0.4 MG PO CAPS: 0.4000 mg | ORAL_CAPSULE | Freq: Every day | ORAL | 0 refills | Status: DC

## 2022-03-03 MED ORDER — FENTANYL CITRATE PF 50 MCG/ML IJ SOSY
100.0000 ug | PREFILLED_SYRINGE | Freq: Once | INTRAMUSCULAR | Status: AC
  Administered 2022-03-03: 100 ug via INTRAVENOUS
  Filled 2022-03-03: qty 2

## 2022-03-03 MED ORDER — OXYCODONE-ACETAMINOPHEN 5-325 MG PO TABS: 1.0000 | ORAL_TABLET | Freq: Three times a day (TID) | ORAL | 0 refills | Status: DC | PRN

## 2022-03-03 MED ORDER — LACTATED RINGERS IV BOLUS
1000.0000 mL | Freq: Once | INTRAVENOUS | Status: AC
Start: 2022-03-03 — End: 2022-03-03
  Administered 2022-03-03: 1000 mL via INTRAVENOUS

## 2022-03-03 MED ORDER — KETOROLAC TROMETHAMINE 30 MG/ML IJ SOLN
30.0000 mg | Freq: Once | INTRAMUSCULAR | Status: AC
  Administered 2022-03-03: 30 mg via INTRAVENOUS
  Filled 2022-03-03: qty 1

## 2022-03-03 NOTE — ED Notes (Signed)
Pt provided water for fluid challenge

## 2022-03-03 NOTE — ED Provider Notes (Signed)
[facility] EMERGENCY DEPARTMENT Provider Note   CSN: [id number] Arrival date & time: 03/02/22  2335     History  Chief Complaint  Patient presents with   Abdominal Pain    Troy Burgess is a 46 y.o. male.  The history is provided by the patient and the spouse.  Abdominal Pain Pain location:  LUQ Pain quality: sharp   Pain radiates to:  Scrotum Pain severity:  Severe Onset quality:  Sudden Duration:  1 hour Timing:  Constant Progression:  Worsening Chronicity:  New Worsened by:  Movement and palpation Associated symptoms: nausea and vomiting   Associated symptoms: no dysuria   Patient reports he was pressing on his abdomen when he started having pain.  He thought he was constipated so he took some enemas without relief.  He now has severe pain that radiates into his testicle.  He has never had this before.    Home Medications Prior to Admission medications   Medication Sig Start Date End Date Taking? Authorizing Provider  ondansetron (ZOFRAN-ODT) 8 MG disintegrating tablet Take 1 tablet (8 mg total) by mouth every 8 (eight) hours as needed. 03/03/22  Yes Ripley Fraise, MD  oxyCODONE-acetaminophen (PERCOCET/ROXICET) 5-325 MG tablet Take 1 tablet by mouth every 8 (eight) hours as needed for severe pain. 03/03/22  Yes Ripley Fraise, MD  tamsulosin (FLOMAX) 0.4 MG CAPS capsule Take 1 capsule (0.4 mg total) by mouth daily after supper. 03/03/22  Yes Ripley Fraise, MD  albuterol (VENTOLIN HFA) 108 (90 Base) MCG/ACT inhaler INHALE 2 PUFFS INTO THE LUNGS EVERY 6 (SIX) HOURS AS NEEDED FOR WHEEZING OR SHORTNESS OF BREATH. 10/21/19 09/01/21  Orma Flaming, MD  anastrozole (ARIMIDEX) 1 MG tablet Take 1/4 tablet by mouth daily for hormone function Patient not taking: Reported on 10/25/2021 02/28/21     anastrozole (ARIMIDEX) 1 MG tablet Take 1 tablet by mouth daily. 10/24/21     atorvastatin (LIPITOR) 40 MG tablet TAKE 1 TABLET (40 MG TOTAL) BY MOUTH DAILY AT 6 PM.  01/06/21 03/04/22  Orma Flaming, MD  benzonatate (TESSALON PERLES) 100 MG capsule Take 1 capsule (100 mg total) by mouth 3 (three) times daily as needed for cough. Patient not taking: Reported on 10/25/2021 10/03/21   Allwardt, Randa Evens, PA-C  clomiPHENE (CLOMID) 50 MG tablet Take 1 tablet by mouth daily for hormone function Patient not taking: Reported on 10/25/2021 02/28/21     clomiPHENE (CLOMID) 50 MG tablet Take 1 tablet by mouth daily. 10/24/21   Alexis Frock, MD  escitalopram (LEXAPRO) 10 MG tablet TAKE 1 TABLET (10 MG TOTAL) BY MOUTH DAILY. 02/20/21 02/20/22  Orma Flaming, MD  fluticasone (FLOVENT HFA) 220 MCG/ACT inhaler INHALE 1 PUFF INTO THE LUNGS 2 (TWO) TIMES DAILY. 02/20/21 02/20/22  Orma Flaming, MD  lansoprazole (PREVACID) 30 MG capsule Take 1 capsule (30 mg total) by mouth in the morning. 11/29/21   Esterwood, Amy S, PA-C  lisdexamfetamine (VYVANSE) 70 MG capsule Take 1 capsule (70 mg total) by mouth daily. 12/12/21     losartan (COZAAR) 50 MG tablet Take 1 tablet (50 mg total) by mouth daily. 12/01/21   Allwardt, Randa Evens, PA-C  Methylphenidate HCl ER, PM, (JORNAY PM) 80 MG CP24 Take 80 mg by mouth nightly 06/07/21     methylPREDNISolone (MEDROL DOSEPAK) 4 MG TBPK tablet Please take per packaging instructions. 10/03/21   Allwardt, Alyssa M, PA-C  montelukast (SINGULAIR) 10 MG tablet Take 1 tablet by mouth at bedtime. 08/23/21   Allwardt, Randa Evens,  PA-C  Multiple Vitamin (MULTIVITAMIN) capsule Take by mouth.    [provider]  omeprazole (PRILOSEC) 20 MG capsule TAKE 1 CAPSULE BY MOUTH 2 TIMES DAILY BEFORE A MEAL FOR 14 DAYS. 01/06/21   Orma Flaming, MD  Semaglutide-Weight Management (WEGOVY) 0.25 MG/0.5ML SOAJ Inject 0.25 mg into the skin once a week. 01/18/22   Allwardt, Randa Evens, PA-C  sildenafil (VIAGRA) 100 MG tablet Take 1/2 to 1 tablet by mouth 1 hour before sexual stimulation 10/24/21     tadalafil (CIALIS) 5 MG tablet TAKE 1 TABLET BY MOUTH EVERY DAY FOR URINARY AND SEXUAL  FUNCTION 08/22/20 08/22/21  Alexis Frock, MD  tadalafil (CIALIS) 5 MG tablet Take 1 tablet by mouth daily for urinary and sexual function 10/24/21     traZODone (DESYREL) 50 MG tablet TAKE 1 TO 2 TABLETS BY MOUTH AT BEDTIME AS NEEDED FOR SLEEP. 02/20/21 02/20/22  Cottle, Billey Co., MD  zolpidem (AMBIEN) 10 MG tablet Take 1 tablet (10 mg total) by mouth nightly as needed for sleep. 02/27/22   Allwardt, Alyssa M, PA-C  QUEtiapine (SEROQUEL) 25 MG tablet TAKE 1 TO 2 TABLETS AT NIGHT AS NEEDED FOR SEVERE INSOMNIA IN PLACE OF TRAZODONE Patient not taking: Reported on 10/31/2020 10/17/20 10/31/20  Purnell Shoemaker., MD      Allergies    Amlodipine, Bupropion, Ciprofloxacin, Codeine, Hydrocodone-acetaminophen, Lisinopril, and Nortriptyline    Review of Systems   Review of Systems  Gastrointestinal:  Positive for abdominal pain, nausea and vomiting.  Genitourinary:  Negative for dysuria.   Physical Exam Updated Vital Signs BP (!) 152/91   Pulse 88   Temp 98 F (36.7 C) (Oral)   Resp 17   SpO2 99%  Physical Exam CONSTITUTIONAL: Well developed/well nourished, anxious and writhing in the bed HEAD: Normocephalic/atraumatic EYES: EOMI/PERRL ENMT: Mucous membranes moist NECK: supple no meningeal signs SPINE/BACK:entire spine nontender CV: S1/S2 noted, no murmurs/rubs/gallops noted LUNGS: Lungs are clear to auscultation bilaterally, no apparent distress ABDOMEN: soft, moderate LLQ tenderness, no rebound or guarding, bowel sounds noted throughout abdomen GU: No cva tenderness Exam performed with chaperone present-testicles are descended bilaterally, no significant tenderness is noted.  No inguinal hernias noted NEURO: Pt is awake/alert/appropriate, moves all extremitiesx4.  No facial droop.   EXTREMITIES: pulses normal/equal, full ROM SKIN: warm, color normal PSYCH: Anxious  ED Results / Procedures / Treatments   Labs (all labs ordered are listed, but only abnormal results are  displayed) Labs Reviewed  COMPREHENSIVE METABOLIC PANEL - Abnormal; Notable for the following components:      Result Value   Glucose, Bld 113 (*)    All other components within normal limits  CBC - Abnormal; Notable for the following components:   WBC 15.0 (*)    All other components within normal limits  URINALYSIS, ROUTINE W REFLEX MICROSCOPIC - Abnormal; Notable for the following components:   Color, Urine AMBER (*)    APPearance HAZY (*)    Hgb urine dipstick LARGE (*)    Protein, ur 30 (*)    RBC / HPF >50 (*)    Bacteria, UA FEW (*)    All other components within normal limits  LIPASE, BLOOD    EKG None  Radiology CT Renal Stone Study  Result Date: 03/03/2022 CLINICAL DATA:  Left lower quadrant pain that radiates to the left testicle. EXAM: CT ABDOMEN AND PELVIS WITHOUT CONTRAST TECHNIQUE: Multidetector CT imaging of the abdomen and pelvis was performed following the standard protocol without IV contrast.  RADIATION DOSE REDUCTION: This exam was performed according to the departmental dose-optimization program which includes automated exposure control, adjustment of the mA and/or kV according to patient size and/or use of iterative reconstruction technique. COMPARISON:  None Available. FINDINGS: Lower chest: No acute abnormality. Hepatobiliary: No focal liver abnormality is seen. No gallstones, gallbladder wall thickening, or biliary dilatation. Pancreas: Unremarkable. No pancreatic ductal dilatation or surrounding inflammatory changes. Spleen: Normal in size without focal abnormality. Adrenals/Urinary Tract: Adrenal glands are unremarkable. Kidneys are normal in size, without focal lesions. A 2 mm nonobstructing renal calculus is seen within the lower pole of the left kidney. A 5 mm obstructing ureteral calculus is seen at the left UVJ, with mild left-sided hydronephrosis and hydroureter. Mild left-sided perinephric inflammatory fat stranding is also seen. The urinary bladder is  poorly distended and subsequently limited in evaluation. Stomach/Bowel: Stomach is within normal limits. Appendix appears normal. No evidence of bowel wall thickening, distention, or inflammatory changes. Vascular/Lymphatic: No significant vascular findings are present. No enlarged abdominal or pelvic lymph nodes. Reproductive: Prostate is unremarkable. Other: No abdominal wall hernia or abnormality. No abdominopelvic ascites. Musculoskeletal: No acute or significant osseous findings. IMPRESSION: 1. 5 mm obstructing ureteral calculus at the left UVJ. 2. 2 mm nonobstructing left renal calculus. Electronically Signed   By: Virgina Norfolk M.D.   On: 03/03/2022 01:31    Procedures Procedures    Medications Ordered in ED Medications  ondansetron (ZOFRAN) injection 4 mg (4 mg Intravenous Given 03/03/22 0050)  fentaNYL (SUBLIMAZE) injection 100 mcg (100 mcg Intravenous Given 03/03/22 0051)  ketorolac (TORADOL) 30 MG/ML injection 30 mg (30 mg Intravenous Given 03/03/22 0139)  ondansetron (ZOFRAN) injection 4 mg (4 mg Intravenous Given 03/03/22 0230)  lactated ringers bolus 1,000 mL (1,000 mLs Intravenous New Bag/Given 03/03/22 0230)    ED Course/ Medical Decision Making/ A&P Clinical Course as of 03/03/22 0437  Sat Mar 03, 2022  0055 WBC(!): 15.0 Leukocytosis [DW]    Clinical Course User Index [DW] Ripley Fraise, MD                           Medical Decision Making Amount and/or Complexity of Data Reviewed Labs: ordered. Decision-making details documented in ED Course. Radiology: ordered.  Risk Prescription drug management.   This patient presents to the ED for concern of abdominal pain, this involves an extensive number of treatment options, and is a complaint that carries with it a high risk of complications and morbidity.  The differential diagnosis includes but is not limited to kidney stone, testicular torsion, diverticulitis, bowel perforation, UTI  Comorbidities that complicate the  patient evaluation: Patient's presentation is complicated by their history of hypertension  Additional history obtained: Records reviewed Primary Care Documents  Lab Tests: I Ordered, and personally interpreted labs.  The pertinent results include: Leukocytosis, hematuria  Imaging Studies ordered: I ordered imaging studies including CT scan CT renal   I independently visualized and interpreted imaging which showed left ureteral stone I agree with the radiologist interpretation  Medicines ordered and prescription drug management: I ordered medication including fentanyl for pain Reevaluation of the patient after these medicines showed that the patient    improved  Reevaluation: After the interventions noted above, I reevaluated the patient and found that they have :improved  Complexity of problems addressed: Patient's presentation is most consistent with  acute presentation with potential threat to life or bodily function  Disposition: After consideration of the diagnostic results and the  patient's response to treatment,  I feel that the patent would benefit from discharge   .           Final Clinical Impression(s) / ED Diagnoses Final diagnoses:  Ureteral stone    Rx / DC Orders ED Discharge Orders          Ordered    oxyCODONE-acetaminophen (PERCOCET/ROXICET) 5-325 MG tablet  Every 8 hours PRN        03/03/22 0435    ondansetron (ZOFRAN-ODT) 8 MG disintegrating tablet  Every 8 hours PRN        03/03/22 0435    tamsulosin (FLOMAX) 0.4 MG CAPS capsule  Daily after supper        03/03/22 0435              Ripley Fraise, MD 03/03/22 [phone]

## 2022-03-03 NOTE — ED Notes (Signed)
Patient transported to CT 

## 2022-03-06 ENCOUNTER — Other Ambulatory Visit (HOSPITAL_COMMUNITY): Payer: Self-pay

## 2022-03-06 MED ORDER — JORNAY PM 100 MG PO CP24
100.0000 mg | ORAL_CAPSULE | Freq: Every evening | ORAL | 0 refills | Status: DC
  Filled 2022-03-06 – 2022-06-12 (×3): qty 30, 30d supply, fill #0

## 2022-03-20 ENCOUNTER — Other Ambulatory Visit: Payer: Self-pay | Admitting: Family Medicine

## 2022-03-20 ENCOUNTER — Other Ambulatory Visit: Payer: Self-pay | Admitting: Physician Assistant

## 2022-03-20 ENCOUNTER — Other Ambulatory Visit (HOSPITAL_COMMUNITY): Payer: Self-pay

## 2022-03-20 MED ORDER — MONTELUKAST SODIUM 10 MG PO TABS
10.0000 mg | ORAL_TABLET | Freq: Every day | ORAL | 0 refills | Status: DC
  Filled 2022-03-20: qty 90, 90d supply, fill #0

## 2022-03-20 MED ORDER — ESCITALOPRAM OXALATE 10 MG PO TABS
10.0000 mg | ORAL_TABLET | Freq: Every day | ORAL | 1 refills | Status: DC
  Filled 2022-03-20: qty 90, 90d supply, fill #0
  Filled 2022-07-05: qty 90, 90d supply, fill #1

## 2022-03-20 MED ORDER — ATORVASTATIN CALCIUM 40 MG PO TABS
40.0000 mg | ORAL_TABLET | Freq: Every day | ORAL | 1 refills | Status: DC
  Filled 2022-03-20: qty 90, 90d supply, fill #0
  Filled 2022-07-05: qty 90, 90d supply, fill #1

## 2022-03-20 MED ORDER — LOSARTAN POTASSIUM 50 MG PO TABS
50.0000 mg | ORAL_TABLET | Freq: Every day | ORAL | 0 refills | Status: DC
  Filled 2022-03-20: qty 90, 90d supply, fill #0

## 2022-03-21 ENCOUNTER — Other Ambulatory Visit (HOSPITAL_COMMUNITY): Payer: Self-pay

## 2022-03-26 ENCOUNTER — Other Ambulatory Visit (HOSPITAL_COMMUNITY): Payer: Self-pay

## 2022-03-27 ENCOUNTER — Other Ambulatory Visit (HOSPITAL_COMMUNITY): Payer: Self-pay

## 2022-03-27 MED ORDER — VYVANSE 70 MG PO CAPS
70.0000 mg | ORAL_CAPSULE | Freq: Every day | ORAL | 0 refills | Status: DC
  Filled 2022-03-27: qty 90, 90d supply, fill #0

## 2022-04-23 ENCOUNTER — Other Ambulatory Visit (HOSPITAL_COMMUNITY): Payer: Self-pay

## 2022-04-23 MED ORDER — VYVANSE 70 MG PO CAPS: 70.0000 mg | ORAL_CAPSULE | Freq: Every day | ORAL | 0 refills | Status: DC

## 2022-05-21 ENCOUNTER — Other Ambulatory Visit: Payer: Self-pay | Admitting: Physician Assistant

## 2022-05-21 ENCOUNTER — Other Ambulatory Visit (HOSPITAL_COMMUNITY): Payer: Self-pay

## 2022-05-23 ENCOUNTER — Other Ambulatory Visit (HOSPITAL_COMMUNITY): Payer: Self-pay

## 2022-05-23 MED ORDER — MONTELUKAST SODIUM 10 MG PO TABS
10.0000 mg | ORAL_TABLET | Freq: Every day | ORAL | 0 refills | Status: DC
  Filled 2022-05-23 – 2022-07-05 (×2): qty 90, 90d supply, fill #0

## 2022-05-23 MED ORDER — ZOLPIDEM TARTRATE 10 MG PO TABS
10.0000 mg | ORAL_TABLET | Freq: Every evening | ORAL | 0 refills | Status: DC | PRN
  Filled 2022-05-23 – 2022-05-30 (×2): qty 90, 90d supply, fill #0

## 2022-05-25 ENCOUNTER — Other Ambulatory Visit (HOSPITAL_COMMUNITY): Payer: Self-pay

## 2022-05-30 ENCOUNTER — Other Ambulatory Visit (HOSPITAL_COMMUNITY): Payer: Self-pay

## 2022-06-12 ENCOUNTER — Other Ambulatory Visit: Payer: Self-pay | Admitting: Physician Assistant

## 2022-06-12 ENCOUNTER — Other Ambulatory Visit (HOSPITAL_COMMUNITY): Payer: Self-pay

## 2022-06-18 ENCOUNTER — Other Ambulatory Visit (HOSPITAL_COMMUNITY): Payer: Self-pay

## 2022-06-18 MED ORDER — LISDEXAMFETAMINE DIMESYLATE 70 MG PO CAPS
70.0000 mg | ORAL_CAPSULE | Freq: Every day | ORAL | 0 refills | Status: DC
  Filled 2022-06-18 – 2022-07-16 (×2): qty 90, 90d supply, fill #0

## 2022-06-25 ENCOUNTER — Encounter: Payer: Self-pay | Admitting: *Deleted

## 2022-07-05 ENCOUNTER — Other Ambulatory Visit: Payer: Self-pay | Admitting: Physician Assistant

## 2022-07-05 ENCOUNTER — Other Ambulatory Visit (HOSPITAL_COMMUNITY): Payer: Self-pay

## 2022-07-05 ENCOUNTER — Other Ambulatory Visit: Payer: Self-pay | Admitting: Urology

## 2022-07-06 ENCOUNTER — Other Ambulatory Visit (HOSPITAL_COMMUNITY): Payer: Self-pay

## 2022-07-06 MED ORDER — LOSARTAN POTASSIUM 50 MG PO TABS
50.0000 mg | ORAL_TABLET | Freq: Every day | ORAL | 0 refills | Status: DC
  Filled 2022-07-06: qty 78, 78d supply, fill #0
  Filled 2022-07-06: qty 12, 12d supply, fill #0

## 2022-07-06 MED ORDER — LANSOPRAZOLE 30 MG PO CPDR
30.0000 mg | DELAYED_RELEASE_CAPSULE | Freq: Every morning | ORAL | 1 refills | Status: AC
  Filled 2022-07-06: qty 90, 90d supply, fill #0
  Filled 2022-10-05: qty 90, 90d supply, fill #1

## 2022-07-06 NOTE — Telephone Encounter (Signed)
Patient needs an appointment for future refills. 

## 2022-07-10 ENCOUNTER — Other Ambulatory Visit (HOSPITAL_COMMUNITY): Payer: Self-pay

## 2022-07-10 MED ORDER — TADALAFIL 5 MG PO TABS
5.0000 mg | ORAL_TABLET | Freq: Every day | ORAL | 3 refills | Status: DC
  Filled 2022-07-10 – 2022-08-21 (×2): qty 90, 90d supply, fill #0
  Filled 2022-10-05 – 2022-11-20 (×2): qty 90, 90d supply, fill #1
  Filled 2023-02-21: qty 90, 90d supply, fill #2
  Filled 2023-06-05: qty 90, 90d supply, fill #3

## 2022-07-10 MED ORDER — CLOMIPHENE CITRATE 50 MG PO TABS
50.0000 mg | ORAL_TABLET | Freq: Every day | ORAL | 3 refills | Status: AC
  Filled 2022-07-10 – 2022-11-20 (×3): qty 90, 90d supply, fill #0
  Filled 2023-02-21: qty 90, 90d supply, fill #1

## 2022-07-10 MED ORDER — ANASTROZOLE 1 MG PO TABS
1.0000 mg | ORAL_TABLET | Freq: Every day | ORAL | 3 refills | Status: AC
  Filled 2022-07-10: qty 90, 90d supply, fill #0
  Filled 2022-11-20: qty 90, 90d supply, fill #1
  Filled 2023-02-21: qty 90, 90d supply, fill #2
  Filled 2023-06-05: qty 90, 90d supply, fill #3

## 2022-07-16 ENCOUNTER — Other Ambulatory Visit (HOSPITAL_COMMUNITY): Payer: Self-pay

## 2022-07-17 ENCOUNTER — Other Ambulatory Visit (HOSPITAL_COMMUNITY): Payer: Self-pay

## 2022-07-18 ENCOUNTER — Other Ambulatory Visit (HOSPITAL_COMMUNITY): Payer: Self-pay

## 2022-07-18 MED ORDER — JORNAY PM 100 MG PO CP24
100.0000 mg | ORAL_CAPSULE | Freq: Every evening | ORAL | 0 refills | Status: DC
  Filled 2022-07-18 – 2022-07-20 (×2): qty 30, 30d supply, fill #0

## 2022-07-20 ENCOUNTER — Other Ambulatory Visit (HOSPITAL_COMMUNITY): Payer: Self-pay

## 2022-08-21 ENCOUNTER — Other Ambulatory Visit (HOSPITAL_COMMUNITY): Payer: Self-pay

## 2022-08-21 ENCOUNTER — Other Ambulatory Visit: Payer: Self-pay | Admitting: Physician Assistant

## 2022-08-21 NOTE — Telephone Encounter (Signed)
Last OV: 10/25/21  Next OV: none scheduled  Last filled: 05/23/22  Quantity: 90 tablets

## 2022-08-22 ENCOUNTER — Other Ambulatory Visit: Payer: Self-pay

## 2022-08-22 ENCOUNTER — Other Ambulatory Visit (HOSPITAL_COMMUNITY): Payer: Self-pay

## 2022-08-22 MED ORDER — ZOLPIDEM TARTRATE 10 MG PO TABS
10.0000 mg | ORAL_TABLET | Freq: Every evening | ORAL | 0 refills | Status: DC | PRN
  Filled 2022-08-22: qty 90, 90d supply, fill #0

## 2022-08-22 MED ORDER — JORNAY PM 100 MG PO CP24
1.0000 | ORAL_CAPSULE | Freq: Every evening | ORAL | 0 refills | Status: DC
  Filled 2022-08-22 – 2022-09-07 (×2): qty 30, 30d supply, fill #0

## 2022-08-22 NOTE — Telephone Encounter (Signed)
Patient scheduled appt for 09/10/22 whenback in town requesting at least a 30 day supply until appt.

## 2022-08-24 ENCOUNTER — Other Ambulatory Visit (HOSPITAL_COMMUNITY): Payer: Self-pay

## 2022-08-27 ENCOUNTER — Other Ambulatory Visit (HOSPITAL_COMMUNITY): Payer: Self-pay

## 2022-09-07 ENCOUNTER — Other Ambulatory Visit (HOSPITAL_COMMUNITY): Payer: Self-pay

## 2022-09-10 ENCOUNTER — Ambulatory Visit (INDEPENDENT_AMBULATORY_CARE_PROVIDER_SITE_OTHER): Payer: No Typology Code available for payment source | Admitting: Physician Assistant

## 2022-09-10 ENCOUNTER — Encounter: Payer: Self-pay | Admitting: Physician Assistant

## 2022-09-10 ENCOUNTER — Other Ambulatory Visit (HOSPITAL_COMMUNITY): Payer: Self-pay

## 2022-09-10 VITALS — BP 122/78 | HR 94 | Temp 98.4°F | Ht 68.0 in | Wt 191.6 lb

## 2022-09-10 DIAGNOSIS — J4540 Moderate persistent asthma, uncomplicated: Secondary | ICD-10-CM

## 2022-09-10 DIAGNOSIS — F419 Anxiety disorder, unspecified: Secondary | ICD-10-CM

## 2022-09-10 DIAGNOSIS — G4700 Insomnia, unspecified: Secondary | ICD-10-CM | POA: Diagnosis not present

## 2022-09-10 DIAGNOSIS — E782 Mixed hyperlipidemia: Secondary | ICD-10-CM

## 2022-09-10 DIAGNOSIS — I10 Essential (primary) hypertension: Secondary | ICD-10-CM

## 2022-09-10 MED ORDER — ATORVASTATIN CALCIUM 40 MG PO TABS
40.0000 mg | ORAL_TABLET | Freq: Every day | ORAL | 1 refills | Status: DC
Start: 2022-09-10 — End: 2023-06-05
  Filled 2022-09-10 – 2022-11-20 (×2): qty 90, 90d supply, fill #0
  Filled 2023-02-21: qty 90, 90d supply, fill #1

## 2022-09-10 MED ORDER — MONTELUKAST SODIUM 10 MG PO TABS
10.0000 mg | ORAL_TABLET | Freq: Every day | ORAL | 0 refills | Status: DC
Start: 2022-09-10 — End: 2022-11-20
  Filled 2022-09-10 – 2022-10-05 (×2): qty 90, 90d supply, fill #0

## 2022-09-10 MED ORDER — ESCITALOPRAM OXALATE 10 MG PO TABS
10.0000 mg | ORAL_TABLET | Freq: Every day | ORAL | 1 refills | Status: DC
Start: 2022-09-10 — End: 2023-06-05
  Filled 2022-09-10 – 2022-10-05 (×2): qty 90, 90d supply, fill #0
  Filled 2022-11-20 – 2023-01-28 (×2): qty 90, 90d supply, fill #1

## 2022-09-10 MED ORDER — ZOLPIDEM TARTRATE 10 MG PO TABS
10.0000 mg | ORAL_TABLET | Freq: Every evening | ORAL | 2 refills | Status: DC | PRN
  Filled 2022-09-10 – 2022-11-20 (×2): qty 30, 30d supply, fill #0

## 2022-09-10 MED ORDER — LOSARTAN POTASSIUM 50 MG PO TABS
50.0000 mg | ORAL_TABLET | Freq: Every day | ORAL | 0 refills | Status: DC
Start: 2022-09-10 — End: 2023-02-21
  Filled 2022-09-10 – 2022-11-20 (×2): qty 90, 90d supply, fill #0

## 2022-09-10 NOTE — Progress Notes (Signed)
Subjective:    Patient ID: Troy Burgess, male    DOB: [birth year], 46 y.o.   MRN: [id number]  Chief Complaint  Patient presents with   Medication Refill    Pt in office for medication refill; pt going to [facility]; pt has no concerns to discuss;     HPI Patient is in today for recheck chronic concerns.  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  He has also started going to [facility].   Intermittent fasting, can only eat 12-8 pm No dairy or salmon the first 3 months No bananas, corn, or pineapple at all.   Past Medical History:  Diagnosis Date   Elevated cholesterol    High blood pressure     Past Surgical History:  Procedure Laterality Date   COLONOSCOPY     COLONOSCOPY  11/08/2020   NO PAST SURGERIES     UPPER GASTROINTESTINAL ENDOSCOPY     UPPER GASTROINTESTINAL ENDOSCOPY  11/08/2020    Family History  Problem Relation Age of Onset   Hypertension Mother    Hypertension Father    Colon cancer Neg Hx    Esophageal cancer Neg Hx    Pancreatic cancer Neg Hx    Stomach cancer Neg Hx    Colon polyps Neg Hx    Rectal cancer Neg Hx     Social History   Tobacco Use   Smoking status: Former    Types: Cigarettes   Smokeless tobacco: Never  Vaping Use   Vaping Use: Never used  Substance Use Topics   Alcohol use: Yes    Alcohol/week: 2.0 standard drinks of alcohol    Types: 2 Glasses of wine per week    Comment: 2 glasses of wine each day   Drug use: Never     Allergies  Allergen Reactions   Amlodipine Swelling    Pedal edema   Bupropion     Other reaction(s): Malaise (intolerance)   Ciprofloxacin     Other reaction(s): Myalgias (intolerance)   Codeine Nausea And Vomiting and Nausea Only   Hydrocodone-Acetaminophen Nausea And Vomiting   Lisinopril     Other reaction(s): Cough (ALLERGY/intolerance)   Nortriptyline     Other reaction(s): Other (See Comments) unknown    Review of  Systems NEGATIVE UNLESS OTHERWISE INDICATED IN HPI      Objective:     BP 122/78 (BP Location: Right Arm)   Pulse 94   Temp 98.4 F (36.9 C) (Temporal)   Ht '5\' 8"'$  (1.727 m)   Wt 191 lb 9.6 oz (86.9 kg)   SpO2 97%   BMI 29.13 kg/m   Wt Readings from Last 3 Encounters:  09/10/22 191 lb 9.6 oz (86.9 kg)  10/25/21 208 lb 9.6 oz (94.6 kg)  10/03/21 212 lb 3.2 oz (96.3 kg)    BP Readings from Last 3 Encounters:  09/10/22 122/78  03/03/22 (!) 152/91  10/25/21 100/60     Physical Exam Vitals reviewed.  Constitutional:      Appearance: Normal appearance.  Cardiovascular:     Rate and Rhythm: Normal rate and regular rhythm.     Pulses: Normal pulses.     Heart sounds: No murmur heard. Pulmonary:     Effort: Pulmonary effort is normal.     Breath sounds: Normal breath sounds.  Neurological:     General: No focal deficit present.     Mental Status: He is alert and oriented to  person, place, and time.  Psychiatric:        Mood and Affect: Mood normal.        Behavior: Behavior normal.        Assessment & Plan:  Hypertension, essential Assessment & Plan: Stable, improved.  Continue on losartan 50 mg.   Mixed hyperlipidemia Assessment & Plan: Lipitor 40 mg.  Plan to recheck fasting labs in the new year.   Insomnia, unspecified type Assessment & Plan: PDMP reviewed today, no red flags, filling appropriately.  Ambien 10 mg as needed at bedtime.   Moderate persistent asthma without complication Assessment & Plan: Uses albuterol inhaler as needed.  Singulair 10 mg at bedtime.   Anxiety Assessment & Plan: Stable and well-controlled.  Lexapro 10 mg daily.   Other orders -     Atorvastatin Calcium; Take 1 tablet (40 mg total) by mouth daily at 6 pm  Dispense: 90 tablet; Refill: 1 -     Escitalopram Oxalate; Take 1 tablet (10 mg total) by mouth daily.  Dispense: 90 tablet; Refill: 1 -     Losartan Potassium; Take 1 tablet (50 mg total) by mouth daily.   Dispense: 90 tablet; Refill: 0 -     Montelukast Sodium; Take 1 tablet (10 mg total) by mouth at bedtime.  Dispense: 90 tablet; Refill: 0 -     Zolpidem Tartrate; Take 1 tablet (10 mg total) by mouth nightly as needed for sleep.  Dispense: 30 tablet; Refill: 2       Return in about 2 months (around 11/11/2022) for fasting labs, overall recheck .  This note was prepared with assistance of Systems analyst. Occasional wrong-word or sound-a-like substitutions may have occurred due to the inherent limitations of voice recognition software.      Lajoya Dombek M Tekisha Darcey, PA-C

## 2022-09-14 NOTE — Assessment & Plan Note (Signed)
Stable and well-controlled.  Lexapro 10 mg daily.

## 2022-09-14 NOTE — Assessment & Plan Note (Signed)
Stable, improved.  Continue on losartan 50 mg.

## 2022-09-14 NOTE — Assessment & Plan Note (Signed)
Uses albuterol inhaler as needed.  Singulair 10 mg at bedtime.

## 2022-09-14 NOTE — Assessment & Plan Note (Signed)
Lipitor 40 mg.  Plan to recheck fasting labs in the new year.

## 2022-09-14 NOTE — Assessment & Plan Note (Signed)
PDMP reviewed today, no red flags, filling appropriately.  Ambien 10 mg as needed at bedtime.

## 2022-10-03 DIAGNOSIS — F4322 Adjustment disorder with anxiety: Secondary | ICD-10-CM | POA: Diagnosis not present

## 2022-10-04 ENCOUNTER — Other Ambulatory Visit (HOSPITAL_COMMUNITY): Payer: Self-pay

## 2022-10-04 MED ORDER — JORNAY PM 100 MG PO CP24
100.0000 mg | ORAL_CAPSULE | Freq: Every day | ORAL | 0 refills | Status: DC
  Filled 2022-10-04 – 2022-10-05 (×2): qty 30, 30d supply, fill #0

## 2022-10-05 ENCOUNTER — Other Ambulatory Visit (HOSPITAL_COMMUNITY): Payer: Self-pay

## 2022-10-05 ENCOUNTER — Other Ambulatory Visit: Payer: Self-pay | Admitting: Physician Assistant

## 2022-10-05 ENCOUNTER — Other Ambulatory Visit: Payer: Self-pay

## 2022-10-05 ENCOUNTER — Other Ambulatory Visit: Payer: Self-pay | Admitting: Family Medicine

## 2022-10-05 DIAGNOSIS — F4322 Adjustment disorder with anxiety: Secondary | ICD-10-CM | POA: Diagnosis not present

## 2022-10-05 MED ORDER — MULTIVITAMINS PO CAPS
1.0000 | ORAL_CAPSULE | Freq: Every day | ORAL | 0 refills | Status: AC
  Filled 2022-10-05: qty 30, 30d supply, fill #0

## 2022-10-08 ENCOUNTER — Other Ambulatory Visit (HOSPITAL_COMMUNITY): Payer: Self-pay

## 2022-10-09 ENCOUNTER — Other Ambulatory Visit (HOSPITAL_COMMUNITY): Payer: Self-pay

## 2022-10-10 DIAGNOSIS — F4322 Adjustment disorder with anxiety: Secondary | ICD-10-CM | POA: Diagnosis not present

## 2022-10-11 ENCOUNTER — Other Ambulatory Visit (HOSPITAL_COMMUNITY): Payer: Self-pay

## 2022-10-12 DIAGNOSIS — F4322 Adjustment disorder with anxiety: Secondary | ICD-10-CM | POA: Diagnosis not present

## 2022-10-15 DIAGNOSIS — F4322 Adjustment disorder with anxiety: Secondary | ICD-10-CM | POA: Diagnosis not present

## 2022-10-17 DIAGNOSIS — F4322 Adjustment disorder with anxiety: Secondary | ICD-10-CM | POA: Diagnosis not present

## 2022-10-31 DIAGNOSIS — F4322 Adjustment disorder with anxiety: Secondary | ICD-10-CM | POA: Diagnosis not present

## 2022-11-02 DIAGNOSIS — F4322 Adjustment disorder with anxiety: Secondary | ICD-10-CM | POA: Diagnosis not present

## 2022-11-05 ENCOUNTER — Other Ambulatory Visit (HOSPITAL_COMMUNITY): Payer: Self-pay

## 2022-11-05 MED ORDER — JORNAY PM 100 MG PO CP24
100.0000 mg | ORAL_CAPSULE | Freq: Every evening | ORAL | 0 refills | Status: DC
  Filled 2022-11-05: qty 30, 30d supply, fill #0

## 2022-11-07 DIAGNOSIS — F4322 Adjustment disorder with anxiety: Secondary | ICD-10-CM | POA: Diagnosis not present

## 2022-11-09 DIAGNOSIS — F4322 Adjustment disorder with anxiety: Secondary | ICD-10-CM | POA: Diagnosis not present

## 2022-11-20 ENCOUNTER — Other Ambulatory Visit: Payer: Self-pay | Admitting: Physician Assistant

## 2022-11-20 ENCOUNTER — Other Ambulatory Visit: Payer: Self-pay | Admitting: Family Medicine

## 2022-11-20 ENCOUNTER — Other Ambulatory Visit (HOSPITAL_COMMUNITY): Payer: Self-pay

## 2022-11-21 ENCOUNTER — Other Ambulatory Visit: Payer: Self-pay

## 2022-11-21 ENCOUNTER — Other Ambulatory Visit (HOSPITAL_COMMUNITY): Payer: Self-pay

## 2022-11-21 DIAGNOSIS — F4322 Adjustment disorder with anxiety: Secondary | ICD-10-CM | POA: Diagnosis not present

## 2022-11-21 MED ORDER — JORNAY PM 100 MG PO CP24
100.0000 mg | ORAL_CAPSULE | Freq: Every evening | ORAL | 0 refills | Status: DC
  Filled 2022-11-21 – 2022-12-07 (×2): qty 30, 30d supply, fill #0

## 2022-11-21 MED ORDER — MONTELUKAST SODIUM 10 MG PO TABS
10.0000 mg | ORAL_TABLET | Freq: Every day | ORAL | 0 refills | Status: DC
  Filled 2022-11-21 – 2022-12-27 (×2): qty 90, 90d supply, fill #0

## 2022-11-22 ENCOUNTER — Other Ambulatory Visit (HOSPITAL_COMMUNITY): Payer: Self-pay

## 2022-11-23 ENCOUNTER — Other Ambulatory Visit (HOSPITAL_COMMUNITY): Payer: Self-pay

## 2022-11-23 DIAGNOSIS — F4322 Adjustment disorder with anxiety: Secondary | ICD-10-CM | POA: Diagnosis not present

## 2022-11-26 ENCOUNTER — Ambulatory Visit: Payer: Self-pay | Admitting: Physician Assistant

## 2022-11-29 DIAGNOSIS — F4322 Adjustment disorder with anxiety: Secondary | ICD-10-CM | POA: Diagnosis not present

## 2022-12-04 ENCOUNTER — Telehealth: Payer: Self-pay | Admitting: Physician Assistant

## 2022-12-04 ENCOUNTER — Ambulatory Visit: Payer: Self-pay | Admitting: Physician Assistant

## 2022-12-05 DIAGNOSIS — F4322 Adjustment disorder with anxiety: Secondary | ICD-10-CM | POA: Diagnosis not present

## 2022-12-07 DIAGNOSIS — F4322 Adjustment disorder with anxiety: Secondary | ICD-10-CM | POA: Diagnosis not present

## 2022-12-08 ENCOUNTER — Other Ambulatory Visit (HOSPITAL_COMMUNITY): Payer: Self-pay

## 2022-12-10 ENCOUNTER — Other Ambulatory Visit (HOSPITAL_COMMUNITY): Payer: Self-pay

## 2022-12-12 DIAGNOSIS — F4322 Adjustment disorder with anxiety: Secondary | ICD-10-CM | POA: Diagnosis not present

## 2022-12-14 DIAGNOSIS — F4322 Adjustment disorder with anxiety: Secondary | ICD-10-CM | POA: Diagnosis not present

## 2022-12-17 ENCOUNTER — Ambulatory Visit (INDEPENDENT_AMBULATORY_CARE_PROVIDER_SITE_OTHER): Payer: 59 | Admitting: Physician Assistant

## 2022-12-17 ENCOUNTER — Other Ambulatory Visit (HOSPITAL_COMMUNITY): Payer: Self-pay

## 2022-12-17 ENCOUNTER — Encounter: Payer: Self-pay | Admitting: Physician Assistant

## 2022-12-17 VITALS — BP 130/80 | HR 80 | Temp 97.8°F | Ht 68.0 in | Wt 201.2 lb

## 2022-12-17 DIAGNOSIS — E782 Mixed hyperlipidemia: Secondary | ICD-10-CM

## 2022-12-17 DIAGNOSIS — E291 Testicular hypofunction: Secondary | ICD-10-CM | POA: Diagnosis not present

## 2022-12-17 DIAGNOSIS — R7303 Prediabetes: Secondary | ICD-10-CM | POA: Diagnosis not present

## 2022-12-17 DIAGNOSIS — G4700 Insomnia, unspecified: Secondary | ICD-10-CM

## 2022-12-17 DIAGNOSIS — I10 Essential (primary) hypertension: Secondary | ICD-10-CM | POA: Diagnosis not present

## 2022-12-17 DIAGNOSIS — B351 Tinea unguium: Secondary | ICD-10-CM | POA: Diagnosis not present

## 2022-12-17 LAB — COMPREHENSIVE METABOLIC PANEL
ALT: 34 U/L (ref 0–53)
AST: 27 U/L (ref 0–37)
Albumin: 4.2 g/dL (ref 3.5–5.2)
Alkaline Phosphatase: 64 U/L (ref 39–117)
BUN: 16 mg/dL (ref 6–23)
CO2: 27 mEq/L (ref 19–32)
Calcium: 9.9 mg/dL (ref 8.4–10.5)
Chloride: 104 mEq/L (ref 96–112)
Creatinine, Ser: 0.89 mg/dL (ref 0.40–1.50)
GFR: 101.18 mL/min (ref >60.00)
Glucose, Bld: 99 mg/dL (ref 70–99)
Potassium: 4.7 mEq/L (ref 3.5–5.1)
Sodium: 139 mEq/L (ref 135–145)
Total Bilirubin: 0.5 mg/dL (ref 0.2–1.2)
Total Protein: 7 g/dL (ref 6.0–8.3)

## 2022-12-17 LAB — CBC WITH DIFFERENTIAL/PLATELET
Basophils Absolute: 0.1 10*3/uL (ref 0.0–0.1)
Basophils Relative: 1 % (ref 0.0–3.0)
Eosinophils Absolute: 0.1 10*3/uL (ref 0.0–0.7)
Eosinophils Relative: 2.1 % (ref 0.0–5.0)
HCT: 49.9 % (ref 39.0–52.0)
Hemoglobin: 16.7 g/dL (ref 13.0–17.0)
Lymphocytes Relative: 30.7 % (ref 12.0–46.0)
Lymphs Abs: 2 10*3/uL (ref 0.7–4.0)
MCHC: 33.4 g/dL (ref 30.0–36.0)
MCV: 87.8 fl (ref 78.0–100.0)
Monocytes Absolute: 0.7 10*3/uL (ref 0.1–1.0)
Monocytes Relative: 10.2 % (ref 3.0–12.0)
Neutro Abs: 3.7 10*3/uL (ref 1.4–7.7)
Neutrophils Relative %: 56 % (ref 43.0–77.0)
Platelets: 276 10*3/uL (ref 150.0–400.0)
RBC: 5.69 Mil/uL (ref 4.22–5.81)
RDW: 13.8 % (ref 11.5–15.5)
WBC: 6.5 10*3/uL (ref 4.0–10.5)

## 2022-12-17 LAB — LIPID PANEL
Cholesterol: 230 mg/dL — ABNORMAL HIGH (ref 0–200)
HDL: 41.6 mg/dL (ref >39.00)
LDL Cholesterol: 170 mg/dL — ABNORMAL HIGH (ref 0–99)
NonHDL: 188.8
Total CHOL/HDL Ratio: 6
Triglycerides: 95 mg/dL (ref 0.0–149.0)
VLDL: 19 mg/dL (ref 0.0–40.0)

## 2022-12-17 LAB — TESTOSTERONE: Testosterone: 519.02 ng/dL (ref 300.00–890.00)

## 2022-12-17 LAB — TSH: TSH: 0.66 u[IU]/mL (ref 0.35–5.50)

## 2022-12-17 LAB — HEMOGLOBIN A1C: Hgb A1c MFr Bld: 5.6 % (ref 4.6–6.5)

## 2022-12-17 MED ORDER — ZOLPIDEM TARTRATE 10 MG PO TABS
10.0000 mg | ORAL_TABLET | Freq: Every evening | ORAL | 2 refills | Status: DC | PRN
  Filled 2022-12-17 – 2022-12-27 (×2): qty 30, 30d supply, fill #0
  Filled 2023-01-28: qty 30, 30d supply, fill #1
  Filled 2023-03-07: qty 30, 30d supply, fill #2

## 2022-12-17 NOTE — Progress Notes (Signed)
Subjective:    Patient ID: Troy Burgess, male    DOB: [birth year], 47 y.o.   MRN: [id number]  Chief Complaint  Patient presents with   Follow-up    Pt in the office for 2 mon f/u; pt fasting for labs; pt wants to discuss left foot big toe nail;     HPI Patient is in today for regular follow-up and fasting labs. See A/P.   Past Medical History:  Diagnosis Date   Elevated cholesterol    High blood pressure     Past Surgical History:  Procedure Laterality Date   COLONOSCOPY     COLONOSCOPY  11/08/2020   NO PAST SURGERIES     UPPER GASTROINTESTINAL ENDOSCOPY     UPPER GASTROINTESTINAL ENDOSCOPY  11/08/2020    Family History  Problem Relation Age of Onset   Hypertension Mother    Hypertension Father    Colon cancer Neg Hx    Esophageal cancer Neg Hx    Pancreatic cancer Neg Hx    Stomach cancer Neg Hx    Colon polyps Neg Hx    Rectal cancer Neg Hx     Social History   Tobacco Use   Smoking status: Former    Types: Cigarettes   Smokeless tobacco: Never  Vaping Use   Vaping Use: Never used  Substance Use Topics   Alcohol use: Yes    Alcohol/week: 2.0 standard drinks of alcohol    Types: 2 Glasses of wine per week    Comment: 2 glasses of wine each day   Drug use: Never     Allergies  Allergen Reactions   Amlodipine Swelling    Pedal edema   Bupropion     Other reaction(s): Malaise (intolerance)   Ciprofloxacin     Other reaction(s): Myalgias (intolerance)   Codeine Nausea And Vomiting and Nausea Only   Hydrocodone-Acetaminophen Nausea And Vomiting   Lisinopril     Other reaction(s): Cough (ALLERGY/intolerance)   Nortriptyline     Other reaction(s): Other (See Comments) unknown    Review of Systems NEGATIVE UNLESS OTHERWISE INDICATED IN HPI      Objective:     BP 130/80 (BP Location: Left Arm)   Pulse 80   Temp 97.8 F (36.6 C) (Temporal)   Ht 5\' 8"  (1.727 m)   Wt 201 lb 3.2 oz (91.3 kg)   SpO2 97%   BMI 30.59 kg/m   Wt Readings  from Last 3 Encounters:  12/17/22 201 lb 3.2 oz (91.3 kg)  09/10/22 191 lb 9.6 oz (86.9 kg)  10/25/21 208 lb 9.6 oz (94.6 kg)    BP Readings from Last 3 Encounters:  12/17/22 130/80  09/10/22 122/78  03/03/22 (!) 152/91     Physical Exam Vitals and nursing note reviewed.  Constitutional:      Appearance: Normal appearance.  Cardiovascular:     Rate and Rhythm: Normal rate and regular rhythm.     Pulses: Normal pulses.     Heart sounds: No murmur heard. Pulmonary:     Effort: Pulmonary effort is normal.     Breath sounds: Normal breath sounds.  Skin:    Comments: Left great toenail onychomycosis   Neurological:     General: No focal deficit present.     Mental Status: He is alert and oriented to person, place, and time.  Psychiatric:        Mood and Affect: Mood normal.        Behavior: Behavior normal.  Assessment & Plan:  Hypertension, essential Assessment & Plan: Stable, controlled, continue on losartan 50 mg daily.   Orders: -     CBC with Differential/Platelet -     Comprehensive metabolic panel -     TSH  Mixed hyperlipidemia Assessment & Plan: Labs today. Lipitor 40 mg daily. The 10-year ASCVD risk score (Arnett DK, et al., 2019) is: 3.5%   Values used to calculate the score:     Age: 13 years     Sex: Male     Is Non-Hispanic African American: No     Diabetic: No     Tobacco smoker: No     Systolic Blood Pressure: [id number] mmHg     Is BP treated: Yes     HDL Cholesterol: 34.8 mg/dL     Total Cholesterol: 157 mg/dL   Orders: -     Lipid panel  Insomnia, unspecified type Assessment & Plan: PDMP reviewed today, no red flags, filling appropriately.  Ambien 10 mg as needed at bedtime.     Pre-diabetes Assessment & Plan: Lab Results  Component Value Date   HGBA1C 5.5 10/25/2021   Repeat labs today Lifestyle changes advised   Orders: -     CBC with Differential/Platelet -     Comprehensive metabolic panel -     Hemoglobin  A1c  Hypogonadism, male Assessment & Plan: Previously had done testosterone injections, but urology discontinued. Will check labs. Refer to Dr. Randol Kern in our office if desiring treatment.  Orders: -     Testosterone -     TSH  Onychomycosis of left great toe -     Ambulatory referral to Podiatry        Return in about 6 months (around 06/19/2023) for med recheck.     Lashonda Sonneborn M Mak Bonny, PA-C

## 2022-12-17 NOTE — Assessment & Plan Note (Signed)
Previously had done testosterone injections, but urology discontinued. Will check labs. Refer to Dr. Randol Kern in our office if desiring treatment.

## 2022-12-17 NOTE — Assessment & Plan Note (Signed)
Stable, controlled, continue on losartan 50 mg daily.

## 2022-12-17 NOTE — Assessment & Plan Note (Signed)
Labs today. Lipitor 40 mg daily. The 10-year ASCVD risk score (Arnett DK, et al., 2019) is: 3.5%   Values used to calculate the score:     Age: 47 years     Sex: Male     Is Non-Hispanic African American: No     Diabetic: No     Tobacco smoker: No     Systolic Blood Pressure: [id number] mmHg     Is BP treated: Yes     HDL Cholesterol: 34.8 mg/dL     Total Cholesterol: 157 mg/dL

## 2022-12-17 NOTE — Assessment & Plan Note (Signed)
Lab Results  Component Value Date   HGBA1C 5.5 10/25/2021   Repeat labs today Lifestyle changes advised

## 2022-12-17 NOTE — Assessment & Plan Note (Signed)
PDMP reviewed today, no red flags, filling appropriately.  Ambien 10 mg as needed at bedtime. 

## 2022-12-19 DIAGNOSIS — F4322 Adjustment disorder with anxiety: Secondary | ICD-10-CM | POA: Diagnosis not present

## 2022-12-20 ENCOUNTER — Other Ambulatory Visit: Payer: Self-pay

## 2022-12-20 DIAGNOSIS — I10 Essential (primary) hypertension: Secondary | ICD-10-CM

## 2022-12-20 DIAGNOSIS — E782 Mixed hyperlipidemia: Secondary | ICD-10-CM

## 2022-12-21 DIAGNOSIS — F4322 Adjustment disorder with anxiety: Secondary | ICD-10-CM | POA: Diagnosis not present

## 2022-12-26 DIAGNOSIS — F4322 Adjustment disorder with anxiety: Secondary | ICD-10-CM | POA: Diagnosis not present

## 2022-12-27 ENCOUNTER — Other Ambulatory Visit (HOSPITAL_COMMUNITY): Payer: Self-pay

## 2022-12-27 DIAGNOSIS — Z79899 Other long term (current) drug therapy: Secondary | ICD-10-CM | POA: Diagnosis not present

## 2022-12-27 DIAGNOSIS — F902 Attention-deficit hyperactivity disorder, combined type: Secondary | ICD-10-CM | POA: Diagnosis not present

## 2022-12-27 MED ORDER — LISDEXAMFETAMINE DIMESYLATE 70 MG PO CAPS
70.0000 mg | ORAL_CAPSULE | Freq: Every day | ORAL | 0 refills | Status: DC
  Filled 2022-12-27: qty 30, 30d supply, fill #0

## 2022-12-28 ENCOUNTER — Other Ambulatory Visit (HOSPITAL_COMMUNITY): Payer: Self-pay

## 2022-12-28 ENCOUNTER — Other Ambulatory Visit: Payer: Self-pay

## 2022-12-28 DIAGNOSIS — F4322 Adjustment disorder with anxiety: Secondary | ICD-10-CM | POA: Diagnosis not present

## 2023-01-02 DIAGNOSIS — F4322 Adjustment disorder with anxiety: Secondary | ICD-10-CM | POA: Diagnosis not present

## 2023-01-04 DIAGNOSIS — F4322 Adjustment disorder with anxiety: Secondary | ICD-10-CM | POA: Diagnosis not present

## 2023-01-09 DIAGNOSIS — F4322 Adjustment disorder with anxiety: Secondary | ICD-10-CM | POA: Diagnosis not present

## 2023-01-11 DIAGNOSIS — F4322 Adjustment disorder with anxiety: Secondary | ICD-10-CM | POA: Diagnosis not present

## 2023-01-16 DIAGNOSIS — F4322 Adjustment disorder with anxiety: Secondary | ICD-10-CM | POA: Diagnosis not present

## 2023-01-22 ENCOUNTER — Ambulatory Visit: Payer: 59 | Admitting: Podiatry

## 2023-01-22 DIAGNOSIS — Z79899 Other long term (current) drug therapy: Secondary | ICD-10-CM | POA: Diagnosis not present

## 2023-01-22 DIAGNOSIS — B351 Tinea unguium: Secondary | ICD-10-CM

## 2023-01-22 NOTE — Progress Notes (Signed)
Subjective:  Patient ID: Troy Burgess, male    DOB: 08-24-1976,  MRN: [id number]  Chief Complaint  Patient presents with   Nail Problem    Discolored left nail     47 y.o. male presents with the above complaint.  Patient presents with left hallux thickened elongated dystrophic mycotic toenails x 1 mild pain on palpation.  She would like to discuss treatment options for it.  She has tried some over-the-counter topical options none of which has helped.  She wanted to be evaluated.  She has not tried Lamisil.   Review of Systems: Negative except as noted in the HPI. Denies N/V/F/Ch.  Past Medical History:  Diagnosis Date   Elevated cholesterol    High blood pressure     Current Outpatient Medications:    anastrozole (ARIMIDEX) 1 MG tablet, Take 1 tablet (1 mg total) by mouth daily., Disp: 90 tablet, Rfl: 3   atorvastatin (LIPITOR) 40 MG tablet, Take 1 tablet (40 mg total) by mouth daily at 6 pm, Disp: 90 tablet, Rfl: 1   clomiPHENE (CLOMID) 50 MG tablet, Take 1 tablet (50 mg total) by mouth daily., Disp: 90 tablet, Rfl: 3   escitalopram (LEXAPRO) 10 MG tablet, Take 1 tablet (10 mg total) by mouth daily., Disp: 90 tablet, Rfl: 1   fluticasone (FLOVENT HFA) 220 MCG/ACT inhaler, INHALE 1 PUFF INTO THE LUNGS 2 (TWO) TIMES DAILY., Disp: 36 g, Rfl: 1   lansoprazole (PREVACID) 30 MG capsule, Take 1 capsule (30 mg total) by mouth in the morning., Disp: 90 capsule, Rfl: 1   lisdexamfetamine (VYVANSE) 70 MG capsule, Take 1 capsule (70 mg total) by mouth daily., Disp: 30 capsule, Rfl: 0   losartan (COZAAR) 50 MG tablet, Take 1 tablet (50 mg total) by mouth daily., Disp: 90 tablet, Rfl: 0   Methylphenidate HCl ER, PM, (JORNAY PM) 100 MG CP24, Take 1 capsule (100 mg total) by mouth every evening., Disp: 30 capsule, Rfl: 0   montelukast (SINGULAIR) 10 MG tablet, Take 1 tablet (10 mg total) by mouth at bedtime., Disp: 90 tablet, Rfl: 0   omeprazole (PRILOSEC) 20 MG capsule, TAKE 1 CAPSULE BY MOUTH 2  TIMES DAILY BEFORE A MEAL FOR 14 DAYS., Disp: 28 capsule, Rfl: 0   tadalafil (CIALIS) 5 MG tablet, Take 1 tablet (5 mg total) by mouth daily for urinary and sexual function, Disp: 90 tablet, Rfl: 3   zolpidem (AMBIEN) 10 MG tablet, Take 1 tablet (10 mg total) by mouth nightly as needed for sleep., Disp: 30 tablet, Rfl: 2  Social History   Tobacco Use  Smoking Status Former   Types: Cigarettes  Smokeless Tobacco Never    Allergies  Allergen Reactions   Amlodipine Swelling    Pedal edema   Bupropion     Other reaction(s): Malaise (intolerance)   Ciprofloxacin     Other reaction(s): Myalgias (intolerance)   Codeine Nausea And Vomiting and Nausea Only   Hydrocodone-Acetaminophen Nausea And Vomiting   Lisinopril     Other reaction(s): Cough (ALLERGY/intolerance)   Nortriptyline     Other reaction(s): Other (See Comments) unknown   Objective:  There were no vitals filed for this visit. There is no height or weight on file to calculate BMI. Constitutional Well developed. Well nourished.  Vascular Dorsalis pedis pulses palpable bilaterally. Posterior tibial pulses palpable bilaterally. Capillary refill normal to all digits.  No cyanosis or clubbing noted. Pedal hair growth normal.  Neurologic Normal speech. Oriented to person, place, and time. Epicritic sensation  to light touch grossly present bilaterally.  Dermatologic Nails thickened elongated dystrophic mycotic toenails x 1 to the left hallux.  Mild pain on palpation of Skin within normal limits  Orthopedic: Normal joint ROM without pain or crepitus bilaterally. No visible deformities. No bony tenderness.   Radiographs: None Assessment:   1. Encounter for long-term current use of medication   2. Nail fungus   3. Onychomycosis due to dermatophyte    Plan:  Patient was evaluated and treated and all questions answered.  Left hallux onychomycosis -Educated the patient on the etiology of onychomycosis and various  treatment options associated with improving the fungal load.  I explained to the patient that there is 3 treatment options available to treat the onychomycosis including topical, p.o., laser treatment.  Patient elected to undergo p.o. options with Lamisil/terbinafine therapy.  In order for me to start the medication therapy, I explained to the patient the importance of evaluating the liver and obtaining the liver function test.  Once the liver function test comes back normal I will start him on 60-month course of Lamisil therapy.  Patient understood all risk and would like to proceed with Lamisil therapy.  I have asked the patient to immediately stop the Lamisil therapy if she has any reactions to it and call the office or go to the emergency room right away.  Patient states understanding   No follow-ups on file.

## 2023-01-28 ENCOUNTER — Other Ambulatory Visit (HOSPITAL_COMMUNITY): Payer: Self-pay

## 2023-01-28 ENCOUNTER — Other Ambulatory Visit: Payer: Self-pay | Admitting: Physician Assistant

## 2023-01-28 ENCOUNTER — Other Ambulatory Visit: Payer: Self-pay

## 2023-01-28 NOTE — Telephone Encounter (Signed)
Left a message for patient to return call to further discuss whether or not he is taking Omeprazole or Prevacid at this time.  Also, patient will need a follow up appointment before further refills can be given. Will continue efforts.

## 2023-01-30 ENCOUNTER — Other Ambulatory Visit (HOSPITAL_COMMUNITY): Payer: Self-pay

## 2023-01-30 DIAGNOSIS — F4322 Adjustment disorder with anxiety: Secondary | ICD-10-CM | POA: Diagnosis not present

## 2023-01-30 MED ORDER — LISDEXAMFETAMINE DIMESYLATE 70 MG PO CAPS
70.0000 mg | ORAL_CAPSULE | Freq: Every day | ORAL | 0 refills | Status: DC
  Filled 2023-01-30: qty 30, 30d supply, fill #0

## 2023-01-30 NOTE — Telephone Encounter (Signed)
Left message for patient to return call to further discuss medications and to schedule a follow up appointment.  Will continue efforts.

## 2023-01-31 ENCOUNTER — Other Ambulatory Visit (HOSPITAL_COMMUNITY): Payer: Self-pay

## 2023-02-01 DIAGNOSIS — F4322 Adjustment disorder with anxiety: Secondary | ICD-10-CM | POA: Diagnosis not present

## 2023-02-11 ENCOUNTER — Other Ambulatory Visit (HOSPITAL_COMMUNITY): Payer: Self-pay

## 2023-02-13 DIAGNOSIS — F4322 Adjustment disorder with anxiety: Secondary | ICD-10-CM | POA: Diagnosis not present

## 2023-02-15 DIAGNOSIS — F4322 Adjustment disorder with anxiety: Secondary | ICD-10-CM | POA: Diagnosis not present

## 2023-02-20 DIAGNOSIS — F4322 Adjustment disorder with anxiety: Secondary | ICD-10-CM | POA: Diagnosis not present

## 2023-02-21 ENCOUNTER — Other Ambulatory Visit (HOSPITAL_COMMUNITY): Payer: Self-pay

## 2023-02-21 ENCOUNTER — Other Ambulatory Visit: Payer: Self-pay | Admitting: Physician Assistant

## 2023-02-21 MED ORDER — LISDEXAMFETAMINE DIMESYLATE 70 MG PO CAPS
70.0000 mg | ORAL_CAPSULE | Freq: Every day | ORAL | 0 refills | Status: DC
  Filled 2023-02-21 – 2023-03-07 (×3): qty 30, 30d supply, fill #0

## 2023-02-22 ENCOUNTER — Other Ambulatory Visit: Payer: Self-pay

## 2023-02-22 ENCOUNTER — Other Ambulatory Visit (HOSPITAL_COMMUNITY): Payer: Self-pay

## 2023-02-22 DIAGNOSIS — F4322 Adjustment disorder with anxiety: Secondary | ICD-10-CM | POA: Diagnosis not present

## 2023-02-22 MED ORDER — LOSARTAN POTASSIUM 50 MG PO TABS
50.0000 mg | ORAL_TABLET | Freq: Every day | ORAL | 0 refills | Status: DC
  Filled 2023-02-22: qty 90, 90d supply, fill #0

## 2023-02-27 DIAGNOSIS — F4322 Adjustment disorder with anxiety: Secondary | ICD-10-CM | POA: Diagnosis not present

## 2023-02-28 ENCOUNTER — Other Ambulatory Visit (HOSPITAL_COMMUNITY): Payer: Self-pay

## 2023-03-01 ENCOUNTER — Other Ambulatory Visit (HOSPITAL_COMMUNITY): Payer: Self-pay

## 2023-03-01 DIAGNOSIS — F4322 Adjustment disorder with anxiety: Secondary | ICD-10-CM | POA: Diagnosis not present

## 2023-03-06 DIAGNOSIS — F4322 Adjustment disorder with anxiety: Secondary | ICD-10-CM | POA: Diagnosis not present

## 2023-03-07 ENCOUNTER — Other Ambulatory Visit (HOSPITAL_COMMUNITY): Payer: Self-pay

## 2023-03-08 DIAGNOSIS — F4322 Adjustment disorder with anxiety: Secondary | ICD-10-CM | POA: Diagnosis not present

## 2023-03-20 DIAGNOSIS — F4322 Adjustment disorder with anxiety: Secondary | ICD-10-CM | POA: Diagnosis not present

## 2023-03-30 ENCOUNTER — Other Ambulatory Visit: Payer: Self-pay | Admitting: Family Medicine

## 2023-03-30 ENCOUNTER — Other Ambulatory Visit (HOSPITAL_COMMUNITY): Payer: Self-pay

## 2023-03-30 ENCOUNTER — Other Ambulatory Visit: Payer: Self-pay | Admitting: Physician Assistant

## 2023-04-01 ENCOUNTER — Other Ambulatory Visit (HOSPITAL_COMMUNITY): Payer: Self-pay

## 2023-04-01 MED ORDER — MONTELUKAST SODIUM 10 MG PO TABS
10.0000 mg | ORAL_TABLET | Freq: Every day | ORAL | 0 refills | Status: DC
  Filled 2023-04-01: qty 90, 90d supply, fill #0

## 2023-04-01 MED ORDER — ZOLPIDEM TARTRATE 10 MG PO TABS
10.0000 mg | ORAL_TABLET | Freq: Every evening | ORAL | 2 refills | Status: DC | PRN
  Filled 2023-04-01 – 2023-04-08 (×2): qty 30, 30d supply, fill #0
  Filled 2023-05-06: qty 30, 30d supply, fill #1
  Filled 2023-06-05: qty 30, 30d supply, fill #2

## 2023-04-01 MED ORDER — LISDEXAMFETAMINE DIMESYLATE 70 MG PO CAPS
70.0000 mg | ORAL_CAPSULE | Freq: Every day | ORAL | 0 refills | Status: DC
  Filled 2023-04-01 – 2023-04-08 (×2): qty 30, 30d supply, fill #0

## 2023-04-01 NOTE — Telephone Encounter (Signed)
Last OV: 12/17/22  Next OV: 06/20/23  Last Filled; 12/17/22  Quantity: 30 w/ 2 refills

## 2023-04-03 DIAGNOSIS — F4322 Adjustment disorder with anxiety: Secondary | ICD-10-CM | POA: Diagnosis not present

## 2023-04-05 DIAGNOSIS — F4322 Adjustment disorder with anxiety: Secondary | ICD-10-CM | POA: Diagnosis not present

## 2023-04-08 ENCOUNTER — Other Ambulatory Visit (HOSPITAL_COMMUNITY): Payer: Self-pay

## 2023-04-10 DIAGNOSIS — F4322 Adjustment disorder with anxiety: Secondary | ICD-10-CM | POA: Diagnosis not present

## 2023-04-12 DIAGNOSIS — F4322 Adjustment disorder with anxiety: Secondary | ICD-10-CM | POA: Diagnosis not present

## 2023-04-17 DIAGNOSIS — F4322 Adjustment disorder with anxiety: Secondary | ICD-10-CM | POA: Diagnosis not present

## 2023-05-02 ENCOUNTER — Other Ambulatory Visit (HOSPITAL_COMMUNITY): Payer: Self-pay

## 2023-05-02 MED ORDER — LISDEXAMFETAMINE DIMESYLATE 70 MG PO CAPS
70.0000 mg | ORAL_CAPSULE | Freq: Every day | ORAL | 0 refills | Status: DC
  Filled 2023-05-06: qty 30, 30d supply, fill #0

## 2023-05-06 ENCOUNTER — Other Ambulatory Visit (HOSPITAL_COMMUNITY): Payer: Self-pay

## 2023-05-15 DIAGNOSIS — F4322 Adjustment disorder with anxiety: Secondary | ICD-10-CM | POA: Diagnosis not present

## 2023-05-16 ENCOUNTER — Encounter (INDEPENDENT_AMBULATORY_CARE_PROVIDER_SITE_OTHER): Payer: Self-pay

## 2023-05-24 ENCOUNTER — Ambulatory Visit: Payer: 59 | Admitting: Podiatry

## 2023-05-29 DIAGNOSIS — F4322 Adjustment disorder with anxiety: Secondary | ICD-10-CM | POA: Diagnosis not present

## 2023-05-31 DIAGNOSIS — F4322 Adjustment disorder with anxiety: Secondary | ICD-10-CM | POA: Diagnosis not present

## 2023-06-05 ENCOUNTER — Other Ambulatory Visit: Payer: Self-pay | Admitting: Physician Assistant

## 2023-06-05 ENCOUNTER — Other Ambulatory Visit (HOSPITAL_COMMUNITY): Payer: Self-pay

## 2023-06-05 DIAGNOSIS — F4322 Adjustment disorder with anxiety: Secondary | ICD-10-CM | POA: Diagnosis not present

## 2023-06-05 MED ORDER — LISDEXAMFETAMINE DIMESYLATE 70 MG PO CAPS
70.0000 mg | ORAL_CAPSULE | Freq: Every day | ORAL | 0 refills | Status: AC
Start: 2023-06-05 — End: ?
  Filled 2023-06-05: qty 30, 30d supply, fill #0

## 2023-06-05 MED ORDER — ESCITALOPRAM OXALATE 10 MG PO TABS
10.0000 mg | ORAL_TABLET | Freq: Every day | ORAL | 1 refills | Status: DC
  Filled 2023-06-05: qty 90, 90d supply, fill #0
  Filled 2023-11-05: qty 90, 90d supply, fill #1

## 2023-06-05 MED ORDER — ATORVASTATIN CALCIUM 40 MG PO TABS
40.0000 mg | ORAL_TABLET | Freq: Every day | ORAL | 1 refills | Status: DC
  Filled 2023-06-05: qty 90, 90d supply, fill #0

## 2023-06-05 MED ORDER — MONTELUKAST SODIUM 10 MG PO TABS
10.0000 mg | ORAL_TABLET | Freq: Every day | ORAL | 0 refills | Status: DC
  Filled 2023-06-05 – 2023-08-05 (×2): qty 90, 90d supply, fill #0

## 2023-06-05 MED ORDER — LOSARTAN POTASSIUM 50 MG PO TABS
50.0000 mg | ORAL_TABLET | Freq: Every day | ORAL | 0 refills | Status: DC
  Filled 2023-06-05: qty 90, 90d supply, fill #0

## 2023-06-07 ENCOUNTER — Other Ambulatory Visit: Payer: Self-pay

## 2023-06-07 ENCOUNTER — Other Ambulatory Visit (HOSPITAL_COMMUNITY): Payer: Self-pay

## 2023-06-07 DIAGNOSIS — F4322 Adjustment disorder with anxiety: Secondary | ICD-10-CM | POA: Diagnosis not present

## 2023-06-12 DIAGNOSIS — F4322 Adjustment disorder with anxiety: Secondary | ICD-10-CM | POA: Diagnosis not present

## 2023-06-14 DIAGNOSIS — F4322 Adjustment disorder with anxiety: Secondary | ICD-10-CM | POA: Diagnosis not present

## 2023-06-17 ENCOUNTER — Encounter: Payer: Self-pay | Admitting: Physician Assistant

## 2023-06-17 NOTE — Telephone Encounter (Signed)
Ok to place orders for future labs

## 2023-06-19 DIAGNOSIS — F4322 Adjustment disorder with anxiety: Secondary | ICD-10-CM | POA: Diagnosis not present

## 2023-06-20 ENCOUNTER — Ambulatory Visit: Payer: 59 | Admitting: Physician Assistant

## 2023-06-21 DIAGNOSIS — F4322 Adjustment disorder with anxiety: Secondary | ICD-10-CM | POA: Diagnosis not present

## 2023-06-24 ENCOUNTER — Other Ambulatory Visit: Payer: Self-pay

## 2023-06-24 DIAGNOSIS — Z0000 Encounter for general adult medical examination without abnormal findings: Secondary | ICD-10-CM

## 2023-06-26 DIAGNOSIS — F4322 Adjustment disorder with anxiety: Secondary | ICD-10-CM | POA: Diagnosis not present

## 2023-06-28 DIAGNOSIS — F4322 Adjustment disorder with anxiety: Secondary | ICD-10-CM | POA: Diagnosis not present

## 2023-07-01 ENCOUNTER — Other Ambulatory Visit (HOSPITAL_COMMUNITY): Payer: Self-pay

## 2023-07-01 ENCOUNTER — Ambulatory Visit (INDEPENDENT_AMBULATORY_CARE_PROVIDER_SITE_OTHER): Payer: 59 | Admitting: Physician Assistant

## 2023-07-01 ENCOUNTER — Encounter: Payer: 59 | Admitting: Physician Assistant

## 2023-07-01 ENCOUNTER — Encounter: Payer: Self-pay | Admitting: Physician Assistant

## 2023-07-01 ENCOUNTER — Other Ambulatory Visit: Payer: Self-pay

## 2023-07-01 VITALS — BP 120/82 | HR 90 | Temp 98.0°F | Ht 68.0 in | Wt 204.0 lb

## 2023-07-01 DIAGNOSIS — J4540 Moderate persistent asthma, uncomplicated: Secondary | ICD-10-CM | POA: Diagnosis not present

## 2023-07-01 DIAGNOSIS — Z0000 Encounter for general adult medical examination without abnormal findings: Secondary | ICD-10-CM

## 2023-07-01 DIAGNOSIS — Z23 Encounter for immunization: Secondary | ICD-10-CM

## 2023-07-01 DIAGNOSIS — E782 Mixed hyperlipidemia: Secondary | ICD-10-CM

## 2023-07-01 DIAGNOSIS — G4700 Insomnia, unspecified: Secondary | ICD-10-CM

## 2023-07-01 LAB — LIPID PANEL
Cholesterol: 243 mg/dL — ABNORMAL HIGH (ref 0–200)
HDL: 33.6 mg/dL — ABNORMAL LOW (ref >39.00)
LDL Cholesterol: 180 mg/dL — ABNORMAL HIGH (ref 0–99)
NonHDL: 209.38
Total CHOL/HDL Ratio: 7
Triglycerides: 149 mg/dL (ref 0.0–149.0)
VLDL: 29.8 mg/dL (ref 0.0–40.0)

## 2023-07-01 LAB — CBC WITH DIFFERENTIAL/PLATELET
Basophils Absolute: 0.1 10*3/uL (ref 0.0–0.1)
Basophils Relative: 0.7 % (ref 0.0–3.0)
Eosinophils Absolute: 0.2 10*3/uL (ref 0.0–0.7)
Eosinophils Relative: 2.1 % (ref 0.0–5.0)
HCT: 48.5 % (ref 39.0–52.0)
Hemoglobin: 15.9 g/dL (ref 13.0–17.0)
Lymphocytes Relative: 30.2 % (ref 12.0–46.0)
Lymphs Abs: 2.4 10*3/uL (ref 0.7–4.0)
MCHC: 32.8 g/dL (ref 30.0–36.0)
MCV: 87.9 fL (ref 78.0–100.0)
Monocytes Absolute: 0.8 10*3/uL (ref 0.1–1.0)
Monocytes Relative: 10.7 % (ref 3.0–12.0)
Neutro Abs: 4.4 10*3/uL (ref 1.4–7.7)
Neutrophils Relative %: 56.3 % (ref 43.0–77.0)
Platelets: 335 10*3/uL (ref 150.0–400.0)
RBC: 5.52 Mil/uL (ref 4.22–5.81)
RDW: 13.5 % (ref 11.5–15.5)
WBC: 7.9 10*3/uL (ref 4.0–10.5)

## 2023-07-01 LAB — COMPREHENSIVE METABOLIC PANEL
ALT: 32 U/L (ref 0–53)
AST: 18 U/L (ref 0–37)
Albumin: 4.1 g/dL (ref 3.5–5.2)
Alkaline Phosphatase: 69 U/L (ref 39–117)
BUN: 14 mg/dL (ref 6–23)
CO2: 26 meq/L (ref 19–32)
Calcium: 9.6 mg/dL (ref 8.4–10.5)
Chloride: 104 meq/L (ref 96–112)
Creatinine, Ser: 0.85 mg/dL (ref 0.40–1.50)
GFR: 102.21 mL/min (ref >60.00)
Glucose, Bld: 94 mg/dL (ref 70–99)
Potassium: 4.8 meq/L (ref 3.5–5.1)
Sodium: 139 meq/L (ref 135–145)
Total Bilirubin: 0.5 mg/dL (ref 0.2–1.2)
Total Protein: 6.8 g/dL (ref 6.0–8.3)

## 2023-07-01 LAB — TESTOSTERONE: Testosterone: 339.05 ng/dL (ref 300.00–890.00)

## 2023-07-01 LAB — HEMOGLOBIN A1C: Hgb A1c MFr Bld: 5.5 % (ref 4.6–6.5)

## 2023-07-01 LAB — PSA: PSA: 1.81 ng/mL (ref 0.10–4.00)

## 2023-07-01 LAB — TSH: TSH: 0.36 u[IU]/mL (ref 0.35–5.50)

## 2023-07-01 MED ORDER — ALBUTEROL SULFATE HFA 108 (90 BASE) MCG/ACT IN AERS
2.0000 | INHALATION_SPRAY | Freq: Four times a day (QID) | RESPIRATORY_TRACT | 2 refills | Status: AC | PRN
Start: 2023-07-01 — End: ?
  Filled 2023-07-01: qty 6.7, 25d supply, fill #0
  Filled 2024-01-30: qty 6.7, 25d supply, fill #1

## 2023-07-01 MED ORDER — FLUTICASONE PROPIONATE HFA 220 MCG/ACT IN AERO
1.0000 | INHALATION_SPRAY | Freq: Two times a day (BID) | RESPIRATORY_TRACT | 2 refills | Status: AC
Start: 2023-07-01 — End: 2024-07-02
  Filled 2023-07-01: qty 12, 60d supply, fill #0
  Filled 2024-01-02: qty 12, 60d supply, fill #1

## 2023-07-01 MED ORDER — ZOLPIDEM TARTRATE 10 MG PO TABS
10.0000 mg | ORAL_TABLET | Freq: Every evening | ORAL | 2 refills | Status: DC | PRN
Start: 2023-07-01 — End: 2023-10-08
  Filled 2023-07-01 – 2023-07-05 (×2): qty 30, 30d supply, fill #0
  Filled 2023-08-05: qty 30, 30d supply, fill #1
  Filled 2023-09-09: qty 30, 30d supply, fill #2

## 2023-07-01 NOTE — Assessment & Plan Note (Signed)
PDMP reviewed today, no red flags, filling appropriately.  Ambien 10 mg as needed at bedtime.  Chronic stable

## 2023-07-01 NOTE — Progress Notes (Signed)
Subjective:    Patient ID: Troy Burgess, male    DOB: [birth year], 47 y.o.   MRN: [id number]  Chief Complaint  Patient presents with   Annual Exam    Pt in the office for annual CPE with fasting labs; these were placed as a future order but not been completed; pt is fasting just didn't have a chance to come in prior to appt; pt going to [place] for 1 month and needs medication filled prior to leaving.     HPI Patient is in today for annual exam.  Acute concerns: Needing labs rechecked  Health maintenance: Lifestyle/ exercise: Walks daily, working on getting weights in weekly  Nutrition: Quit fast food, working on nutrition at home  Mental health: Doing well  Sleep: Doing well, usually takes Ambien (travels often) Substance use: None  ETOH: Truly here and there Immunizations: Tdap updated  Colonoscopy: UTD 11/08/2020   Considering CT cardiac score - doesn't always take his cholesterol medication   Past Medical History:  Diagnosis Date   Elevated cholesterol    High blood pressure     Past Surgical History:  Procedure Laterality Date   COLONOSCOPY     COLONOSCOPY  11/08/2020   NO PAST SURGERIES     UPPER GASTROINTESTINAL ENDOSCOPY     UPPER GASTROINTESTINAL ENDOSCOPY  11/08/2020    Family History  Problem Relation Age of Onset   Hypertension Mother    Hypertension Father    Colon cancer Neg Hx    Esophageal cancer Neg Hx    Pancreatic cancer Neg Hx    Stomach cancer Neg Hx    Colon polyps Neg Hx    Rectal cancer Neg Hx     Social History   Tobacco Use   Smoking status: Former    Types: Cigarettes   Smokeless tobacco: Never  Vaping Use   Vaping status: Never Used  Substance Use Topics   Alcohol use: Yes    Alcohol/week: 2.0 standard drinks of alcohol    Types: 2 Glasses of wine per week    Comment: 2 glasses of wine each day   Drug use: Never     Allergies  Allergen Reactions   Amlodipine Swelling    Pedal edema   Bupropion     Other  reaction(s): Malaise (intolerance)   Ciprofloxacin     Other reaction(s): Myalgias (intolerance)   Codeine Nausea And Vomiting and Nausea Only   Hydrocodone-Acetaminophen Nausea And Vomiting   Lisinopril     Other reaction(s): Cough (ALLERGY/intolerance)   Nortriptyline     Other reaction(s): Other (See Comments) unknown    Review of Systems NEGATIVE UNLESS OTHERWISE INDICATED IN HPI      Objective:     BP 120/82 (BP Location: Left Arm)   Pulse 90   Temp 98 F (36.7 C) (Temporal)   Ht 5\' 8"  (1.727 m)   Wt 204 lb (92.5 kg)   SpO2 97%   BMI 31.02 kg/m   Wt Readings from Last 3 Encounters:  07/01/23 204 lb (92.5 kg)  12/17/22 201 lb 3.2 oz (91.3 kg)  09/10/22 191 lb 9.6 oz (86.9 kg)    BP Readings from Last 3 Encounters:  07/01/23 120/82  12/17/22 130/80  09/10/22 122/78     Physical Exam Vitals and nursing note reviewed.  Constitutional:      General: He is not in acute distress.    Appearance: Normal appearance. He is not toxic-appearing.  HENT:     Head: Normocephalic  and atraumatic.     Right Ear: Tympanic membrane, ear canal and external ear normal.     Left Ear: Tympanic membrane, ear canal and external ear normal.     Nose: Nose normal.     Mouth/Throat:     Mouth: Mucous membranes are moist.     Pharynx: Oropharynx is clear.  Eyes:     Extraocular Movements: Extraocular movements intact.     Conjunctiva/sclera: Conjunctivae normal.     Pupils: Pupils are equal, round, and reactive to light.  Cardiovascular:     Rate and Rhythm: Normal rate and regular rhythm.     Pulses: Normal pulses.     Heart sounds: Normal heart sounds.  Pulmonary:     Effort: Pulmonary effort is normal.     Breath sounds: Normal breath sounds.  Musculoskeletal:        General: Normal range of motion.     Cervical back: Normal range of motion and neck supple.  Skin:    General: Skin is warm and dry.  Neurological:     General: No focal deficit present.     Mental  Status: He is alert and oriented to person, place, and time.  Psychiatric:        Mood and Affect: Mood normal.        Behavior: Behavior normal.        Assessment & Plan:   Problem List Items Addressed This Visit       Respiratory   Moderate persistent asthma without complication    Chronic  Uses albuterol inhaler as needed.  Flovent during flare-ups. Singulair 10 mg at bedtime. Refilled Rx's today.       Relevant Medications   fluticasone (FLOVENT HFA) 220 MCG/ACT inhaler   albuterol (PROAIR HFA) 108 (90 Base) MCG/ACT inhaler     Other   Hyperlipidemia    Labs today. Lipitor 40 mg daily. Not always consistent with taking statin. Consider CT cardiac score.  The 10-year ASCVD risk score (Arnett DK, et al., 2019) is: 4.9%   Values used to calculate the score:     Age: 70 years     Sex: Male     Is Non-Hispanic African American: No     Diabetic: No     Tobacco smoker: No     Systolic Blood Pressure: 120 mmHg     Is BP treated: Yes     HDL Cholesterol: 41.6 mg/dL     Total Cholesterol: 230 mg/dL       Insomnia    PDMP reviewed today, no red flags, filling appropriately.  Ambien 10 mg as needed at bedtime.  Chronic stable       Relevant Medications   zolpidem (AMBIEN) 10 MG tablet   Other Visit Diagnoses     Encounter for annual physical exam    -  Primary   Immunization due       Relevant Orders   Tdap vaccine greater than or equal to 7yo IM (Completed)         Meds ordered this encounter  Medications   fluticasone (FLOVENT HFA) 220 MCG/ACT inhaler    Sig: Inhale 1 puff into the lungs 2 (two) times daily.    Dispense:  12 g    Refill:  2    Order Specific Question:   Supervising Provider    Answer:   Shelva Majestic [[id number]]   albuterol (PROAIR HFA) 108 (90 Base) MCG/ACT inhaler    Sig: Inhale 2 puffs  into the lungs every 6 (six) hours as needed for wheezing or shortness of breath.    Dispense:  6.7 g    Refill:  2    Order Specific Question:    Supervising Provider    Answer:   Shelva Majestic [[id number]]   zolpidem (AMBIEN) 10 MG tablet    Sig: Take 1 tablet (10 mg total) by mouth nightly as needed for sleep.    Dispense:  30 tablet    Refill:  2    Order Specific Question:   Supervising Provider    Answer:   Shelva Majestic [[id number]]   Age-appropriate screening and counseling performed today. Will check labs and call with results. Preventive measures discussed and printed in AVS for patient.   Patient Counseling: [x]   Nutrition: Stressed importance of moderation in sodium/caffeine intake, saturated fat and cholesterol, caloric balance, sufficient intake of fresh fruits, vegetables, and fiber.  [x]   Stressed the importance of regular exercise.   [x]   Substance Abuse: Discussed cessation/primary prevention of tobacco, alcohol, or other drug use; driving or other dangerous activities under the influence; availability of treatment for abuse.   []   Injury prevention: Discussed safety belts, safety helmets, smoke detector, smoking near bedding or upholstery.   []   Sexuality: Discussed sexually transmitted diseases, partner selection, use of condoms, avoidance of unintended pregnancy  and contraceptive alternatives.   [x]   Dental health: Discussed importance of regular tooth brushing, flossing, and dental visits.  [x]   Health maintenance and immunizations reviewed. Please refer to Health maintenance section.       Ziair Penson M Adalina Dopson, PA-C

## 2023-07-01 NOTE — Assessment & Plan Note (Signed)
Labs today. Lipitor 40 mg daily. Not always consistent with taking statin. Consider CT cardiac score.  The 10-year ASCVD risk score (Arnett DK, et al., 2019) is: 4.9%   Values used to calculate the score:     Age: 47 years     Sex: Male     Is Non-Hispanic African American: No     Diabetic: No     Tobacco smoker: No     Systolic Blood Pressure: 120 mmHg     Is BP treated: Yes     HDL Cholesterol: 41.6 mg/dL     Total Cholesterol: 230 mg/dL

## 2023-07-01 NOTE — Assessment & Plan Note (Signed)
Chronic  Uses albuterol inhaler as needed.  Flovent during flare-ups. Singulair 10 mg at bedtime. Refilled Rx's today.

## 2023-07-03 DIAGNOSIS — F4322 Adjustment disorder with anxiety: Secondary | ICD-10-CM | POA: Diagnosis not present

## 2023-07-04 ENCOUNTER — Other Ambulatory Visit (HOSPITAL_COMMUNITY): Payer: Self-pay

## 2023-07-04 DIAGNOSIS — Z79899 Other long term (current) drug therapy: Secondary | ICD-10-CM | POA: Diagnosis not present

## 2023-07-04 DIAGNOSIS — F902 Attention-deficit hyperactivity disorder, combined type: Secondary | ICD-10-CM | POA: Diagnosis not present

## 2023-07-04 MED ORDER — LISDEXAMFETAMINE DIMESYLATE 70 MG PO CAPS
70.0000 mg | ORAL_CAPSULE | Freq: Every day | ORAL | 0 refills | Status: DC
Start: 2023-07-04 — End: 2023-08-05
  Filled 2023-07-04: qty 30, 30d supply, fill #0

## 2023-07-05 ENCOUNTER — Other Ambulatory Visit (HOSPITAL_COMMUNITY): Payer: Self-pay

## 2023-07-05 DIAGNOSIS — F4322 Adjustment disorder with anxiety: Secondary | ICD-10-CM | POA: Diagnosis not present

## 2023-07-09 ENCOUNTER — Encounter: Payer: Self-pay | Admitting: Physician Assistant

## 2023-07-10 NOTE — Telephone Encounter (Signed)
Please see patient message and advise.

## 2023-07-12 ENCOUNTER — Other Ambulatory Visit: Payer: Self-pay | Admitting: Physician Assistant

## 2023-07-12 DIAGNOSIS — R7989 Other specified abnormal findings of blood chemistry: Secondary | ICD-10-CM

## 2023-07-12 DIAGNOSIS — E782 Mixed hyperlipidemia: Secondary | ICD-10-CM

## 2023-07-19 ENCOUNTER — Other Ambulatory Visit (HOSPITAL_COMMUNITY): Payer: Self-pay

## 2023-07-31 DIAGNOSIS — F4322 Adjustment disorder with anxiety: Secondary | ICD-10-CM | POA: Diagnosis not present

## 2023-08-02 DIAGNOSIS — F4322 Adjustment disorder with anxiety: Secondary | ICD-10-CM | POA: Diagnosis not present

## 2023-08-05 ENCOUNTER — Other Ambulatory Visit: Payer: Self-pay

## 2023-08-05 ENCOUNTER — Other Ambulatory Visit (HOSPITAL_COMMUNITY): Payer: Self-pay

## 2023-08-05 MED ORDER — LISDEXAMFETAMINE DIMESYLATE 70 MG PO CAPS
70.0000 mg | ORAL_CAPSULE | Freq: Every day | ORAL | 0 refills | Status: DC
Start: 2023-08-05 — End: 2023-09-02
  Filled 2023-08-05: qty 30, 30d supply, fill #0

## 2023-08-07 DIAGNOSIS — F4322 Adjustment disorder with anxiety: Secondary | ICD-10-CM | POA: Diagnosis not present

## 2023-08-09 DIAGNOSIS — F4322 Adjustment disorder with anxiety: Secondary | ICD-10-CM | POA: Diagnosis not present

## 2023-08-14 DIAGNOSIS — F4322 Adjustment disorder with anxiety: Secondary | ICD-10-CM | POA: Diagnosis not present

## 2023-08-15 ENCOUNTER — Ambulatory Visit
Admission: RE | Admit: 2023-08-15 | Discharge: 2023-08-15 | Disposition: A | Discharge status: Home / Self Care | Payer: No Typology Code available for payment source | Source: Ambulatory Visit | Attending: Physician Assistant | Admitting: Physician Assistant

## 2023-08-15 DIAGNOSIS — E782 Mixed hyperlipidemia: Secondary | ICD-10-CM

## 2023-08-16 ENCOUNTER — Encounter: Payer: Self-pay | Admitting: Physician Assistant

## 2023-08-16 ENCOUNTER — Other Ambulatory Visit: Payer: Self-pay

## 2023-08-16 DIAGNOSIS — K760 Fatty (change of) liver, not elsewhere classified: Secondary | ICD-10-CM

## 2023-08-16 NOTE — Telephone Encounter (Signed)
Please see pt response to lab results, referral placed for patient

## 2023-08-21 ENCOUNTER — Other Ambulatory Visit (HOSPITAL_COMMUNITY): Payer: Self-pay

## 2023-08-21 ENCOUNTER — Ambulatory Visit: Payer: 59 | Admitting: Podiatry

## 2023-08-21 ENCOUNTER — Other Ambulatory Visit: Payer: Self-pay | Admitting: Physician Assistant

## 2023-08-21 DIAGNOSIS — E669 Obesity, unspecified: Secondary | ICD-10-CM

## 2023-08-21 DIAGNOSIS — E782 Mixed hyperlipidemia: Secondary | ICD-10-CM

## 2023-08-21 DIAGNOSIS — F4322 Adjustment disorder with anxiety: Secondary | ICD-10-CM | POA: Diagnosis not present

## 2023-08-21 DIAGNOSIS — K760 Fatty (change of) liver, not elsewhere classified: Secondary | ICD-10-CM

## 2023-08-21 MED ORDER — SEMAGLUTIDE-WEIGHT MANAGEMENT 0.25 MG/0.5ML ~~LOC~~ SOAJ
0.2500 mg | SUBCUTANEOUS | 0 refills | Status: DC
Start: 2023-08-21 — End: 2024-01-29
  Filled 2023-08-21 – 2023-12-05 (×3): qty 2, 28d supply, fill #0

## 2023-08-21 NOTE — Telephone Encounter (Signed)
Please see pt response and advise 

## 2023-08-22 ENCOUNTER — Other Ambulatory Visit (HOSPITAL_COMMUNITY): Payer: Self-pay

## 2023-08-23 ENCOUNTER — Other Ambulatory Visit (HOSPITAL_COMMUNITY): Payer: Self-pay

## 2023-08-23 ENCOUNTER — Encounter (HOSPITAL_COMMUNITY): Payer: Self-pay

## 2023-08-23 DIAGNOSIS — F4322 Adjustment disorder with anxiety: Secondary | ICD-10-CM | POA: Diagnosis not present

## 2023-09-02 ENCOUNTER — Other Ambulatory Visit (HOSPITAL_COMMUNITY): Payer: Self-pay

## 2023-09-02 MED ORDER — LISDEXAMFETAMINE DIMESYLATE 70 MG PO CAPS
70.0000 mg | ORAL_CAPSULE | Freq: Every day | ORAL | 0 refills | Status: DC
  Filled 2023-09-02: qty 30, 30d supply, fill #0

## 2023-09-09 ENCOUNTER — Other Ambulatory Visit: Payer: Self-pay

## 2023-09-09 ENCOUNTER — Other Ambulatory Visit (HOSPITAL_COMMUNITY): Payer: Self-pay

## 2023-09-11 DIAGNOSIS — F4322 Adjustment disorder with anxiety: Secondary | ICD-10-CM | POA: Diagnosis not present

## 2023-09-13 ENCOUNTER — Other Ambulatory Visit (HOSPITAL_COMMUNITY): Payer: Self-pay

## 2023-09-13 DIAGNOSIS — F4322 Adjustment disorder with anxiety: Secondary | ICD-10-CM | POA: Diagnosis not present

## 2023-09-18 ENCOUNTER — Encounter: Payer: Self-pay | Admitting: Physician Assistant

## 2023-09-18 DIAGNOSIS — F4322 Adjustment disorder with anxiety: Secondary | ICD-10-CM | POA: Diagnosis not present

## 2023-09-20 DIAGNOSIS — F4322 Adjustment disorder with anxiety: Secondary | ICD-10-CM | POA: Diagnosis not present

## 2023-09-23 ENCOUNTER — Telehealth: Payer: Self-pay

## 2023-09-23 NOTE — Telephone Encounter (Signed)
Copied from CRM [id number]. Topic: Referral - Status >> Sep 23, 2023  2:22 PM Florestine Avers wrote: Reason for CRM: Pt tried to make an appt with liver specialist but was unable to due to them saying they never received his referral. He is asking for the referral to be resent and a phone call from the nurse to confirm that it has been resent as well.  Please see patient call message in regards to referral placed for hepatology. Spoke with Alcario Drought via Teams and she is currently working on referral and speaking with office for patient and getting scheduled.

## 2023-09-23 NOTE — Telephone Encounter (Signed)
After Alcario Drought sent again, I also faxed hard copy to fax number provided by office and received confirmation fax was sent.

## 2023-09-23 NOTE — Telephone Encounter (Signed)
Please see patient message in regards to referral, message sent to Laverle Patter to review and resubmit referral for patient

## 2023-10-03 ENCOUNTER — Other Ambulatory Visit: Payer: Self-pay | Admitting: Family Medicine

## 2023-10-03 ENCOUNTER — Other Ambulatory Visit (HOSPITAL_COMMUNITY): Payer: Self-pay

## 2023-10-03 MED ORDER — LISDEXAMFETAMINE DIMESYLATE 70 MG PO CAPS
70.0000 mg | ORAL_CAPSULE | Freq: Every day | ORAL | 0 refills | Status: DC
  Filled 2023-10-03: qty 30, 30d supply, fill #0

## 2023-10-04 ENCOUNTER — Other Ambulatory Visit (HOSPITAL_COMMUNITY): Payer: Self-pay

## 2023-10-04 ENCOUNTER — Encounter (HOSPITAL_COMMUNITY): Payer: Self-pay

## 2023-10-08 ENCOUNTER — Other Ambulatory Visit (HOSPITAL_COMMUNITY): Payer: Self-pay

## 2023-10-08 ENCOUNTER — Other Ambulatory Visit: Payer: Self-pay | Admitting: Physician Assistant

## 2023-10-08 ENCOUNTER — Other Ambulatory Visit: Payer: Self-pay

## 2023-10-08 DIAGNOSIS — G4700 Insomnia, unspecified: Secondary | ICD-10-CM

## 2023-10-08 MED ORDER — TADALAFIL 5 MG PO TABS
5.0000 mg | ORAL_TABLET | Freq: Every day | ORAL | 3 refills | Status: AC
  Filled 2023-10-08: qty 90, 90d supply, fill #0
  Filled 2024-01-02: qty 90, 90d supply, fill #1
  Filled 2024-03-21: qty 90, 90d supply, fill #2
  Filled 2024-08-07: qty 90, 90d supply, fill #3

## 2023-10-08 MED ORDER — ZOLPIDEM TARTRATE 10 MG PO TABS
10.0000 mg | ORAL_TABLET | Freq: Every evening | ORAL | 2 refills | Status: DC | PRN
  Filled 2023-10-08: qty 30, 30d supply, fill #0
  Filled 2023-11-05: qty 30, 30d supply, fill #1
  Filled 2023-12-05: qty 30, 30d supply, fill #2

## 2023-10-08 NOTE — Telephone Encounter (Signed)
 Last OV: 07/01/23  Next OV: 12/30/23  Last Filled: 07/01/23   Quantity: 30 w/ 2 refills

## 2023-10-08 NOTE — Telephone Encounter (Signed)
 Refill for Tadafadil has been sent to pt pharmacy; referral issue sent to Referral coordinator to be escalated per Tammy. Please refill Ambien for patient

## 2023-10-08 NOTE — Telephone Encounter (Signed)
 Can we please get some assistance with this referral for this patient. We have verified fax numbers and have hard faxed referral and still telling patient it hasn't been received. Thanks

## 2023-10-09 DIAGNOSIS — F4322 Adjustment disorder with anxiety: Secondary | ICD-10-CM | POA: Diagnosis not present

## 2023-10-11 DIAGNOSIS — F4322 Adjustment disorder with anxiety: Secondary | ICD-10-CM | POA: Diagnosis not present

## 2023-10-15 ENCOUNTER — Other Ambulatory Visit (HOSPITAL_COMMUNITY): Payer: Self-pay

## 2023-10-16 DIAGNOSIS — F4322 Adjustment disorder with anxiety: Secondary | ICD-10-CM | POA: Diagnosis not present

## 2023-10-23 DIAGNOSIS — F4322 Adjustment disorder with anxiety: Secondary | ICD-10-CM | POA: Diagnosis not present

## 2023-10-30 DIAGNOSIS — F4322 Adjustment disorder with anxiety: Secondary | ICD-10-CM | POA: Diagnosis not present

## 2023-11-01 DIAGNOSIS — F4322 Adjustment disorder with anxiety: Secondary | ICD-10-CM | POA: Diagnosis not present

## 2023-11-04 ENCOUNTER — Other Ambulatory Visit (HOSPITAL_COMMUNITY): Payer: Self-pay

## 2023-11-04 MED ORDER — LISDEXAMFETAMINE DIMESYLATE 70 MG PO CAPS
70.0000 mg | ORAL_CAPSULE | Freq: Every day | ORAL | 0 refills | Status: DC
  Filled 2023-11-04 – 2023-11-05 (×2): qty 30, 30d supply, fill #0

## 2023-11-05 ENCOUNTER — Other Ambulatory Visit: Payer: Self-pay

## 2023-11-05 ENCOUNTER — Other Ambulatory Visit (HOSPITAL_COMMUNITY): Payer: Self-pay

## 2023-11-05 ENCOUNTER — Other Ambulatory Visit: Payer: Self-pay | Admitting: Physician Assistant

## 2023-11-05 MED ORDER — MONTELUKAST SODIUM 10 MG PO TABS
10.0000 mg | ORAL_TABLET | Freq: Every day | ORAL | 0 refills | Status: DC
  Filled 2023-11-05: qty 90, 90d supply, fill #0

## 2023-11-06 DIAGNOSIS — F4322 Adjustment disorder with anxiety: Secondary | ICD-10-CM | POA: Diagnosis not present

## 2023-11-08 DIAGNOSIS — F4322 Adjustment disorder with anxiety: Secondary | ICD-10-CM | POA: Diagnosis not present

## 2023-11-13 DIAGNOSIS — F4322 Adjustment disorder with anxiety: Secondary | ICD-10-CM | POA: Diagnosis not present

## 2023-11-15 DIAGNOSIS — F4322 Adjustment disorder with anxiety: Secondary | ICD-10-CM | POA: Diagnosis not present

## 2023-11-20 DIAGNOSIS — F4322 Adjustment disorder with anxiety: Secondary | ICD-10-CM | POA: Diagnosis not present

## 2023-11-22 DIAGNOSIS — F4322 Adjustment disorder with anxiety: Secondary | ICD-10-CM | POA: Diagnosis not present

## 2023-11-27 DIAGNOSIS — F4322 Adjustment disorder with anxiety: Secondary | ICD-10-CM | POA: Diagnosis not present

## 2023-12-02 ENCOUNTER — Other Ambulatory Visit (HOSPITAL_COMMUNITY): Payer: Self-pay

## 2023-12-02 MED ORDER — LISDEXAMFETAMINE DIMESYLATE 70 MG PO CAPS
70.0000 mg | ORAL_CAPSULE | Freq: Every day | ORAL | 0 refills | Status: DC
  Filled 2023-12-05: qty 30, 30d supply, fill #0

## 2023-12-04 DIAGNOSIS — F4322 Adjustment disorder with anxiety: Secondary | ICD-10-CM | POA: Diagnosis not present

## 2023-12-05 ENCOUNTER — Other Ambulatory Visit (HOSPITAL_COMMUNITY): Payer: Self-pay

## 2023-12-30 ENCOUNTER — Ambulatory Visit: Payer: Self-pay | Admitting: Physician Assistant

## 2024-01-02 ENCOUNTER — Other Ambulatory Visit: Payer: Self-pay | Admitting: Physician Assistant

## 2024-01-02 ENCOUNTER — Other Ambulatory Visit: Payer: Self-pay

## 2024-01-02 ENCOUNTER — Encounter: Payer: Self-pay | Admitting: Physician Assistant

## 2024-01-02 ENCOUNTER — Other Ambulatory Visit (HOSPITAL_COMMUNITY): Payer: Self-pay

## 2024-01-02 DIAGNOSIS — G4700 Insomnia, unspecified: Secondary | ICD-10-CM

## 2024-01-02 MED ORDER — LISDEXAMFETAMINE DIMESYLATE 70 MG PO CAPS
70.0000 mg | ORAL_CAPSULE | Freq: Every day | ORAL | 0 refills | Status: DC
  Filled 2024-01-02 – 2024-01-03 (×2): qty 30, 30d supply, fill #0

## 2024-01-02 MED ORDER — LOSARTAN POTASSIUM 50 MG PO TABS
50.0000 mg | ORAL_TABLET | Freq: Every day | ORAL | 0 refills | Status: DC
  Filled 2024-01-02: qty 90, 90d supply, fill #0

## 2024-01-02 MED ORDER — ESCITALOPRAM OXALATE 10 MG PO TABS
10.0000 mg | ORAL_TABLET | Freq: Every day | ORAL | 1 refills | Status: AC
  Filled 2024-01-02: qty 90, 90d supply, fill #0

## 2024-01-02 MED ORDER — MONTELUKAST SODIUM 10 MG PO TABS
10.0000 mg | ORAL_TABLET | Freq: Every day | ORAL | 0 refills | Status: DC
  Filled 2024-01-02 – 2024-03-21 (×2): qty 90, 90d supply, fill #0

## 2024-01-02 NOTE — Telephone Encounter (Signed)
 Last OV: 07/01/23  Next OV: None  Last Filled: 10/08/23  Quantity: 30 w/ 2 refills

## 2024-01-02 NOTE — Telephone Encounter (Signed)
 Please see pt msg and advise if anything needed. Not able to send in refill for pt medication due to restrictions

## 2024-01-03 ENCOUNTER — Other Ambulatory Visit (HOSPITAL_COMMUNITY): Payer: Self-pay

## 2024-01-03 ENCOUNTER — Other Ambulatory Visit: Payer: Self-pay

## 2024-01-03 MED ORDER — ZOLPIDEM TARTRATE 10 MG PO TABS
10.0000 mg | ORAL_TABLET | Freq: Every evening | ORAL | 2 refills | Status: DC | PRN
Start: 2024-01-03 — End: 2024-01-27
  Filled 2024-01-03: qty 13, 13d supply, fill #0
  Filled 2024-01-03: qty 17, 17d supply, fill #0

## 2024-01-06 DIAGNOSIS — Z79899 Other long term (current) drug therapy: Secondary | ICD-10-CM | POA: Diagnosis not present

## 2024-01-06 DIAGNOSIS — F902 Attention-deficit hyperactivity disorder, combined type: Secondary | ICD-10-CM | POA: Diagnosis not present

## 2024-01-07 ENCOUNTER — Other Ambulatory Visit (HOSPITAL_COMMUNITY): Payer: Self-pay

## 2024-01-08 DIAGNOSIS — F4322 Adjustment disorder with anxiety: Secondary | ICD-10-CM | POA: Diagnosis not present

## 2024-01-10 DIAGNOSIS — F4322 Adjustment disorder with anxiety: Secondary | ICD-10-CM | POA: Diagnosis not present

## 2024-01-15 DIAGNOSIS — K760 Fatty (change of) liver, not elsewhere classified: Secondary | ICD-10-CM | POA: Diagnosis not present

## 2024-01-15 DIAGNOSIS — F4322 Adjustment disorder with anxiety: Secondary | ICD-10-CM | POA: Diagnosis not present

## 2024-01-21 ENCOUNTER — Ambulatory Visit: Payer: Self-pay | Admitting: Physician Assistant

## 2024-01-22 DIAGNOSIS — F4322 Adjustment disorder with anxiety: Secondary | ICD-10-CM | POA: Diagnosis not present

## 2024-01-24 DIAGNOSIS — F4322 Adjustment disorder with anxiety: Secondary | ICD-10-CM | POA: Diagnosis not present

## 2024-01-27 ENCOUNTER — Ambulatory Visit (INDEPENDENT_AMBULATORY_CARE_PROVIDER_SITE_OTHER): Payer: Self-pay | Admitting: Physician Assistant

## 2024-01-27 ENCOUNTER — Encounter: Payer: Self-pay | Admitting: Physician Assistant

## 2024-01-27 ENCOUNTER — Other Ambulatory Visit (HOSPITAL_COMMUNITY): Payer: Self-pay

## 2024-01-27 VITALS — BP 138/88 | HR 105 | Temp 97.7°F | Ht 68.0 in | Wt 208.8 lb

## 2024-01-27 DIAGNOSIS — E6609 Other obesity due to excess calories: Secondary | ICD-10-CM | POA: Diagnosis not present

## 2024-01-27 DIAGNOSIS — G4700 Insomnia, unspecified: Secondary | ICD-10-CM

## 2024-01-27 DIAGNOSIS — E782 Mixed hyperlipidemia: Secondary | ICD-10-CM

## 2024-01-27 DIAGNOSIS — Z6831 Body mass index (BMI) 31.0-31.9, adult: Secondary | ICD-10-CM | POA: Diagnosis not present

## 2024-01-27 DIAGNOSIS — I10 Essential (primary) hypertension: Secondary | ICD-10-CM | POA: Diagnosis not present

## 2024-01-27 DIAGNOSIS — E66811 Obesity, class 1: Secondary | ICD-10-CM

## 2024-01-27 DIAGNOSIS — K760 Fatty (change of) liver, not elsewhere classified: Secondary | ICD-10-CM | POA: Insufficient documentation

## 2024-01-27 MED ORDER — ZOLPIDEM TARTRATE 10 MG PO TABS
10.0000 mg | ORAL_TABLET | Freq: Every evening | ORAL | 2 refills | Status: DC | PRN
  Filled 2024-01-27 – 2024-02-05 (×3): qty 30, 30d supply, fill #0

## 2024-01-27 MED ORDER — TIRZEPATIDE-WEIGHT MANAGEMENT 2.5 MG/0.5ML ~~LOC~~ SOLN: 2.5000 mg | SUBCUTANEOUS | 0 refills | Status: DC

## 2024-01-27 NOTE — Progress Notes (Unsigned)
    Patient ID: Troy Burgess, male    DOB: July 22, 1976, 48 y.o.   MRN: [id number]   Assessment & Plan:  There are no diagnoses linked to this encounter.   Assessment & Plan       No follow-ups on file.    Subjective:    No chief complaint on file.   HPI Discussed the use of AI scribe software for clinical note transcription with the patient, who gave verbal consent to proceed.  History of Present Illness      Past Medical History:  Diagnosis Date   Elevated cholesterol    High blood pressure     Past Surgical History:  Procedure Laterality Date   COLONOSCOPY     COLONOSCOPY  11/08/2020   NO PAST SURGERIES     UPPER GASTROINTESTINAL ENDOSCOPY     UPPER GASTROINTESTINAL ENDOSCOPY  11/08/2020    Family History  Problem Relation Age of Onset   Hypertension Mother    Hypertension Father    Colon cancer Neg Hx    Esophageal cancer Neg Hx    Pancreatic cancer Neg Hx    Stomach cancer Neg Hx    Colon polyps Neg Hx    Rectal cancer Neg Hx     Social History   Tobacco Use   Smoking status: Former    Types: Cigarettes   Smokeless tobacco: Never  Vaping Use   Vaping status: Never Used  Substance Use Topics   Alcohol use: Yes    Alcohol/week: 2.0 standard drinks of alcohol    Types: 2 Glasses of wine per week    Comment: 2 glasses of wine each day   Drug use: Never     Allergies  Allergen Reactions   Amlodipine Swelling    Pedal edema   Bupropion     Other reaction(s): Malaise (intolerance)   Ciprofloxacin     Other reaction(s): Myalgias (intolerance)   Codeine Nausea And Vomiting and Nausea Only   Hydrocodone-Acetaminophen  Nausea And Vomiting   Lisinopril     Other reaction(s): Cough (ALLERGY/intolerance)   Nortriptyline     Other reaction(s): Other (See Comments) unknown    Review of Systems NEGATIVE UNLESS OTHERWISE INDICATED IN HPI      Objective:     There were no vitals taken for this visit.  Wt Readings from Last 3  Encounters:  07/01/23 204 lb (92.5 kg)  12/17/22 201 lb 3.2 oz (91.3 kg)  09/10/22 191 lb 9.6 oz (86.9 kg)    BP Readings from Last 3 Encounters:  07/01/23 120/82  12/17/22 130/80  09/10/22 122/78     Physical Exam          Troy Onofre M Mozetta Murfin, PA-C

## 2024-01-29 DIAGNOSIS — F4322 Adjustment disorder with anxiety: Secondary | ICD-10-CM | POA: Diagnosis not present

## 2024-01-30 ENCOUNTER — Other Ambulatory Visit (HOSPITAL_COMMUNITY): Payer: Self-pay

## 2024-01-31 DIAGNOSIS — F4322 Adjustment disorder with anxiety: Secondary | ICD-10-CM | POA: Diagnosis not present

## 2024-02-03 ENCOUNTER — Other Ambulatory Visit (HOSPITAL_COMMUNITY): Payer: Self-pay

## 2024-02-03 ENCOUNTER — Encounter: Payer: Self-pay | Admitting: Physician Assistant

## 2024-02-03 MED ORDER — LISDEXAMFETAMINE DIMESYLATE 70 MG PO CAPS
70.0000 mg | ORAL_CAPSULE | Freq: Every day | ORAL | 0 refills | Status: DC
  Filled 2024-02-03: qty 30, 30d supply, fill #0

## 2024-02-04 ENCOUNTER — Other Ambulatory Visit (HOSPITAL_COMMUNITY): Payer: Self-pay

## 2024-02-04 NOTE — Telephone Encounter (Signed)
 Please see pt msg and request

## 2024-02-05 ENCOUNTER — Encounter: Payer: Self-pay | Admitting: Physician Assistant

## 2024-02-05 ENCOUNTER — Other Ambulatory Visit (HOSPITAL_COMMUNITY): Payer: Self-pay

## 2024-02-07 NOTE — Telephone Encounter (Signed)
 FYI: Patient wife is coming to pick up documents up front in the folder

## 2024-02-12 ENCOUNTER — Other Ambulatory Visit: Payer: Self-pay | Admitting: Physician Assistant

## 2024-02-12 DIAGNOSIS — E66811 Obesity, class 1: Secondary | ICD-10-CM

## 2024-02-12 DIAGNOSIS — F4322 Adjustment disorder with anxiety: Secondary | ICD-10-CM | POA: Diagnosis not present

## 2024-02-12 DIAGNOSIS — E782 Mixed hyperlipidemia: Secondary | ICD-10-CM

## 2024-02-12 DIAGNOSIS — I10 Essential (primary) hypertension: Secondary | ICD-10-CM

## 2024-02-12 DIAGNOSIS — K760 Fatty (change of) liver, not elsewhere classified: Secondary | ICD-10-CM

## 2024-02-14 DIAGNOSIS — F4322 Adjustment disorder with anxiety: Secondary | ICD-10-CM | POA: Diagnosis not present

## 2024-02-18 ENCOUNTER — Encounter: Payer: Self-pay | Admitting: Physician Assistant

## 2024-02-18 NOTE — Telephone Encounter (Signed)
 Please see pt msg regarding medications and running out while out of the country and advise

## 2024-02-19 ENCOUNTER — Ambulatory Visit: Admitting: Physician Assistant

## 2024-02-19 DIAGNOSIS — F4322 Adjustment disorder with anxiety: Secondary | ICD-10-CM | POA: Diagnosis not present

## 2024-02-19 NOTE — Telephone Encounter (Signed)
 Please see pt msg and advise patient

## 2024-02-19 NOTE — Telephone Encounter (Signed)
 Spoke with patient and advised no openings showing for tomorrow and PCP not in office Friday, pt leaves on Saturday for [place]. Pt is stating will need 15 Ambien  and 15 Vyvanse  while in [place] due to prescription running out before returning to have filled. Please advise if wanting to add on patient anywhere for tomorrow 02/20/24.

## 2024-02-19 NOTE — Telephone Encounter (Signed)
 Called pt and lvm and also sent MyChart msg advising of appt today at 11:30. If unable to complete appt today we will cancel and reschedule for patient. Awaiting patient reply

## 2024-02-20 ENCOUNTER — Telehealth: Admitting: Physician Assistant

## 2024-02-20 ENCOUNTER — Encounter: Payer: Self-pay | Admitting: Physician Assistant

## 2024-02-20 ENCOUNTER — Other Ambulatory Visit (HOSPITAL_COMMUNITY): Payer: Self-pay

## 2024-02-20 VITALS — Ht 68.0 in | Wt 208.0 lb

## 2024-02-20 DIAGNOSIS — E782 Mixed hyperlipidemia: Secondary | ICD-10-CM | POA: Diagnosis not present

## 2024-02-20 DIAGNOSIS — E66811 Obesity, class 1: Secondary | ICD-10-CM

## 2024-02-20 DIAGNOSIS — G4700 Insomnia, unspecified: Secondary | ICD-10-CM

## 2024-02-20 DIAGNOSIS — Z6831 Body mass index (BMI) 31.0-31.9, adult: Secondary | ICD-10-CM | POA: Diagnosis not present

## 2024-02-20 DIAGNOSIS — E6609 Other obesity due to excess calories: Secondary | ICD-10-CM

## 2024-02-20 DIAGNOSIS — K760 Fatty (change of) liver, not elsewhere classified: Secondary | ICD-10-CM | POA: Diagnosis not present

## 2024-02-20 MED ORDER — TIRZEPATIDE-WEIGHT MANAGEMENT 5 MG/0.5ML ~~LOC~~ SOLN: 5.0000 mg | SUBCUTANEOUS | 1 refills | Status: DC

## 2024-02-20 MED ORDER — ZOLPIDEM TARTRATE 10 MG PO TABS
10.0000 mg | ORAL_TABLET | Freq: Every evening | ORAL | 0 refills | Status: DC | PRN
Start: 2024-02-20 — End: 2024-03-21
  Filled 2024-02-20 (×2): qty 16, 16d supply, fill #0

## 2024-02-20 MED ORDER — LISDEXAMFETAMINE DIMESYLATE 60 MG PO CAPS
60.0000 mg | ORAL_CAPSULE | Freq: Every day | ORAL | 0 refills | Status: AC
  Filled 2024-02-20 – 2024-02-21 (×2): qty 16, 16d supply, fill #0

## 2024-02-20 NOTE — Telephone Encounter (Signed)
Patient scheduled for virtual visit today.  

## 2024-02-20 NOTE — Progress Notes (Signed)
   Virtual Visit via Video Note  I connected with  Troy Burgess  on 02/20/24 at 11:30 AM EDT by a video enabled telemedicine application and verified that I am speaking with the correct person using two identifiers.  Location: Patient: home Provider: Nature conservation officer at [employer] Persons present: Patient and myself   I discussed the limitations of evaluation and management by telemedicine and the availability of in person appointments. The patient expressed understanding and agreed to proceed.   History of Present Illness:  Discussed the use of AI scribe software for clinical note transcription with the patient, who gave verbal consent to proceed.  History of Present Illness Troy Burgess is a 48 year old male who presents for medication management prior to international travel.  He is preparing for an upcoming trip to [place] and is concerned about running out of his sleep medication, Ambien , while abroad. He currently takes Ambien  10 mg nightly to manage his sleep. He last filled his prescription on Feb 05, 2024, which is expected to last until March 06, 2024. However, he will be traveling until March 23, 2024, necessitating an early refill of 16 tablets to cover the duration of his trip.  He is also using Zepbound  for weight management. He has been on a 2.5 mg dose, which he reports is working well. He has completed his first month of treatment and will require a new prescription upon his return from [place]. He has not experienced any issues obtaining the medication from [facility], noting the process was efficient and quick.  He has a past medical history of insomnia, obesity, and fatty liver. He works as a travel Water quality scientist and is planning a trip to [place].     Observations/Objective:   Gen: Awake, alert, no acute distress Resp: Breathing is even and non-labored Psych: calm/pleasant demeanor Neuro: Alert and Oriented x 3, + facial symmetry, speech is  clear.   Assessment and Plan:  Assessment and Plan Assessment & Plan Obesity Obesity with a BMI of 31.6, currently managed with Zepbound  2.5 mg, showing good tolerance and effectiveness. - Increase Zepbound  to 5 mg after return from travel, with prescription to be filled on March 23, 2024. - Instruct to report any adverse effects with the increased dose.  Fatty liver Fatty liver associated with obesity.  Insomnia Chronic insomnia managed with Ambien  10 mg nightly. Early refill approved to ensure adequate supply during travel to [place]. - Approve early refill of Ambien  10 mg for 16 tablets for travel period. - Document early refill approval for pharmacy due to international travel. PDMP reviewed today, no red flags, filling appropriately.      Follow Up Instructions:    I discussed the assessment and treatment plan with the patient. The patient was provided an opportunity to ask questions and all were answered. The patient agreed with the plan and demonstrated an understanding of the instructions.   The patient was advised to call back or seek an in-person evaluation if the symptoms worsen or if the condition fails to improve as anticipated.  Troy Burgess M Troy Cervi, PA-C

## 2024-02-21 ENCOUNTER — Other Ambulatory Visit (HOSPITAL_COMMUNITY): Payer: Self-pay

## 2024-02-21 DIAGNOSIS — F4322 Adjustment disorder with anxiety: Secondary | ICD-10-CM | POA: Diagnosis not present

## 2024-03-21 ENCOUNTER — Other Ambulatory Visit (HOSPITAL_BASED_OUTPATIENT_CLINIC_OR_DEPARTMENT_OTHER): Payer: Self-pay

## 2024-03-21 ENCOUNTER — Other Ambulatory Visit (HOSPITAL_COMMUNITY): Payer: Self-pay

## 2024-03-21 ENCOUNTER — Other Ambulatory Visit: Payer: Self-pay | Admitting: Physician Assistant

## 2024-03-21 ENCOUNTER — Other Ambulatory Visit: Payer: Self-pay

## 2024-03-21 DIAGNOSIS — G4700 Insomnia, unspecified: Secondary | ICD-10-CM

## 2024-03-23 ENCOUNTER — Other Ambulatory Visit (HOSPITAL_COMMUNITY): Payer: Self-pay

## 2024-03-23 MED ORDER — LISDEXAMFETAMINE DIMESYLATE 70 MG PO CAPS
70.0000 mg | ORAL_CAPSULE | Freq: Every day | ORAL | 0 refills | Status: DC
  Filled 2024-03-23: qty 30, 30d supply, fill #0

## 2024-03-23 MED ORDER — ZOLPIDEM TARTRATE 10 MG PO TABS
10.0000 mg | ORAL_TABLET | Freq: Every evening | ORAL | 0 refills | Status: DC | PRN
Start: 2024-03-23 — End: 2024-04-20
  Filled 2024-03-23: qty 30, 30d supply, fill #0

## 2024-03-23 NOTE — Telephone Encounter (Signed)
 Last OV: 02/20/24  Next OV: 07/02/24  Last Filled: 02/20/24  Quantity: 16

## 2024-03-25 DIAGNOSIS — F4322 Adjustment disorder with anxiety: Secondary | ICD-10-CM | POA: Diagnosis not present

## 2024-03-27 DIAGNOSIS — F4322 Adjustment disorder with anxiety: Secondary | ICD-10-CM | POA: Diagnosis not present

## 2024-04-01 DIAGNOSIS — F4322 Adjustment disorder with anxiety: Secondary | ICD-10-CM | POA: Diagnosis not present

## 2024-04-02 DIAGNOSIS — F4322 Adjustment disorder with anxiety: Secondary | ICD-10-CM | POA: Diagnosis not present

## 2024-04-08 DIAGNOSIS — F4322 Adjustment disorder with anxiety: Secondary | ICD-10-CM | POA: Diagnosis not present

## 2024-04-10 DIAGNOSIS — F4322 Adjustment disorder with anxiety: Secondary | ICD-10-CM | POA: Diagnosis not present

## 2024-04-15 DIAGNOSIS — F4322 Adjustment disorder with anxiety: Secondary | ICD-10-CM | POA: Diagnosis not present

## 2024-04-20 ENCOUNTER — Other Ambulatory Visit: Payer: Self-pay | Admitting: Physician Assistant

## 2024-04-20 ENCOUNTER — Other Ambulatory Visit (HOSPITAL_COMMUNITY): Payer: Self-pay

## 2024-04-20 DIAGNOSIS — G4700 Insomnia, unspecified: Secondary | ICD-10-CM

## 2024-04-20 MED ORDER — ZOLPIDEM TARTRATE 10 MG PO TABS
10.0000 mg | ORAL_TABLET | Freq: Every evening | ORAL | 0 refills | Status: DC | PRN
  Filled 2024-04-20: qty 30, 30d supply, fill #0

## 2024-04-20 MED ORDER — LISDEXAMFETAMINE DIMESYLATE 70 MG PO CAPS
70.0000 mg | ORAL_CAPSULE | Freq: Every day | ORAL | 0 refills | Status: DC
  Filled 2024-04-21: qty 30, 30d supply, fill #0

## 2024-04-20 NOTE — Telephone Encounter (Signed)
 Last OV: 01/27/24  Next OV: 07/02/24  Last Filled: 03/23/24  Quantity: 30

## 2024-04-21 ENCOUNTER — Other Ambulatory Visit: Payer: Self-pay

## 2024-04-21 ENCOUNTER — Other Ambulatory Visit (HOSPITAL_COMMUNITY): Payer: Self-pay

## 2024-04-22 DIAGNOSIS — F4322 Adjustment disorder with anxiety: Secondary | ICD-10-CM | POA: Diagnosis not present

## 2024-05-01 IMAGING — CT CT RENAL STONE PROTOCOL
2 of 4 series · 16 of 46 positions shown, 18 images · non-contrast
Comparison: None Available.

CLINICAL DATA: Left lower quadrant pain that radiates to the left
testicle.



[Series 3: ap without · axial · non-contrast · 0.85mm/px · z∈[-863,-388]mm · 13 of 107 slices shown, 15 images]
[im 6/107  soft-tissue]
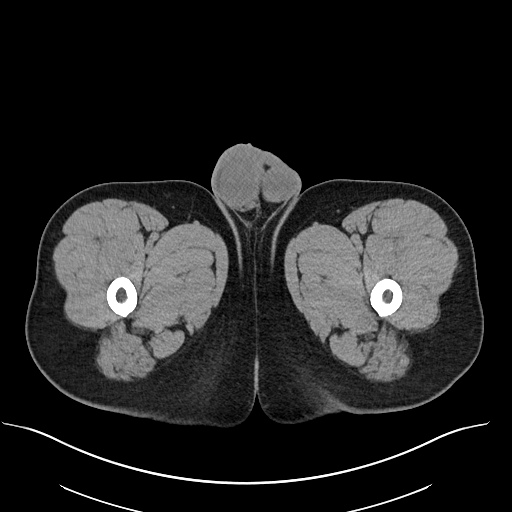
[im 6/107  bone]
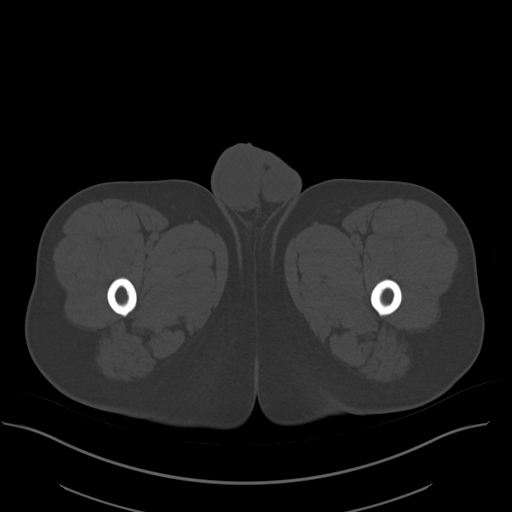
[im 16/107  soft-tissue]
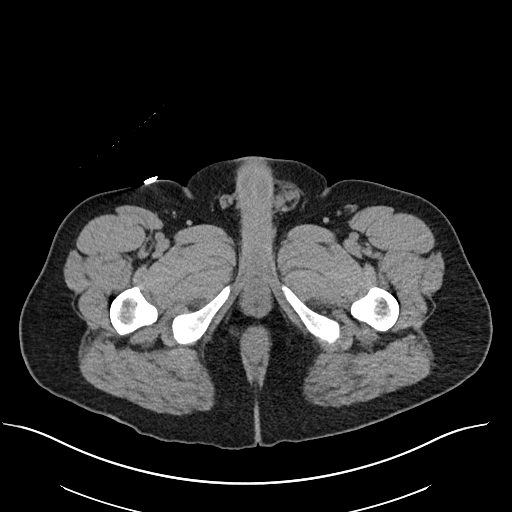
[im 22/107  soft-tissue]
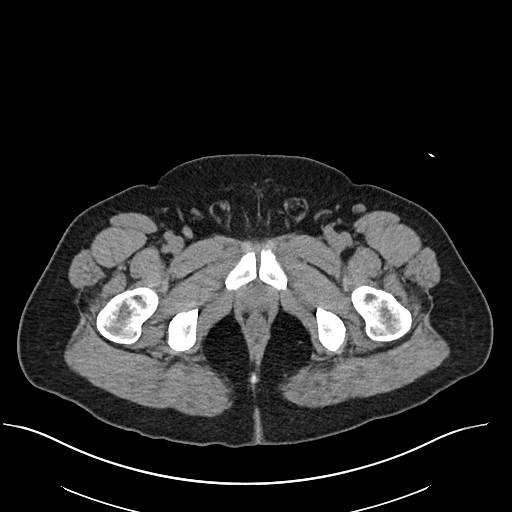
[im 32/107  soft-tissue]
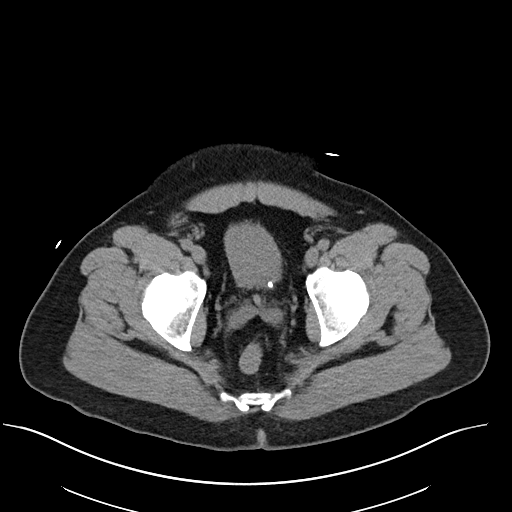
[im 38/107  soft-tissue]
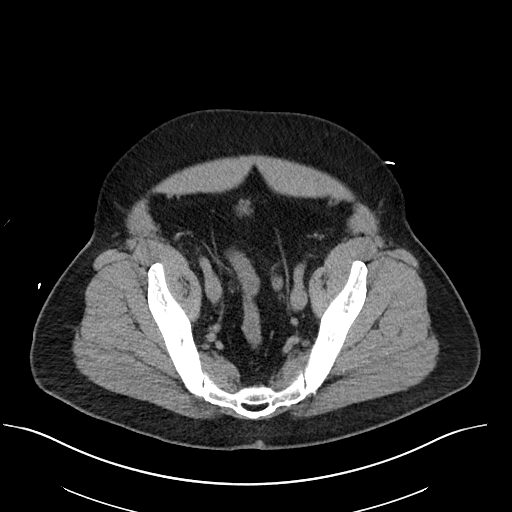
[im 48/107  soft-tissue]
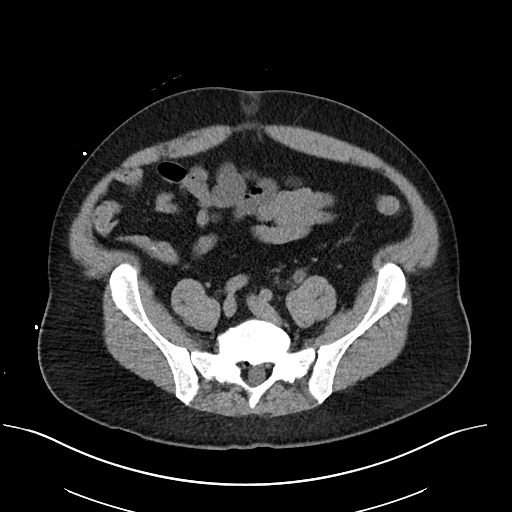
[im 54/107  soft-tissue]
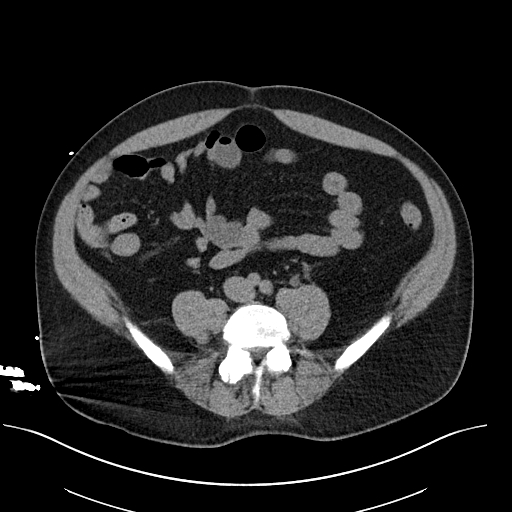
[im 59/107  soft-tissue]
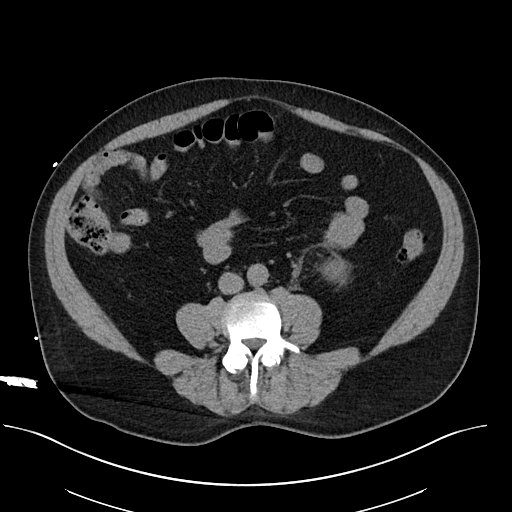
[im 69/107  soft-tissue]
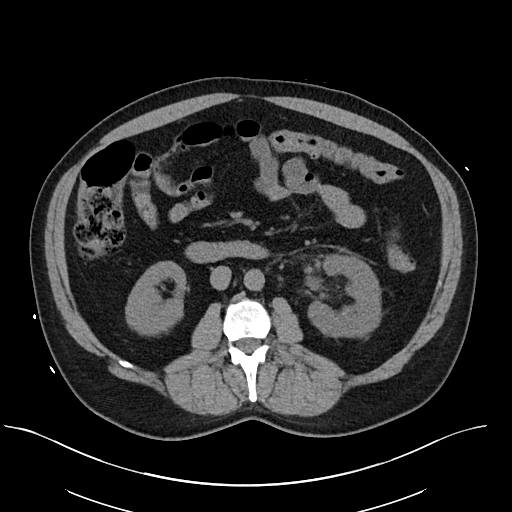
[im 69/107  bone]
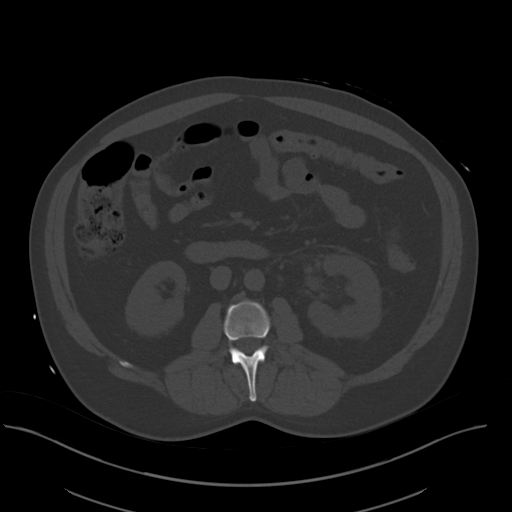
[im 75/107  soft-tissue]
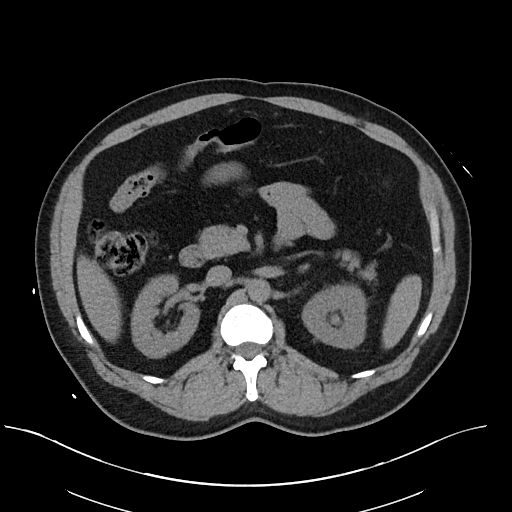
[im 85/107  soft-tissue]
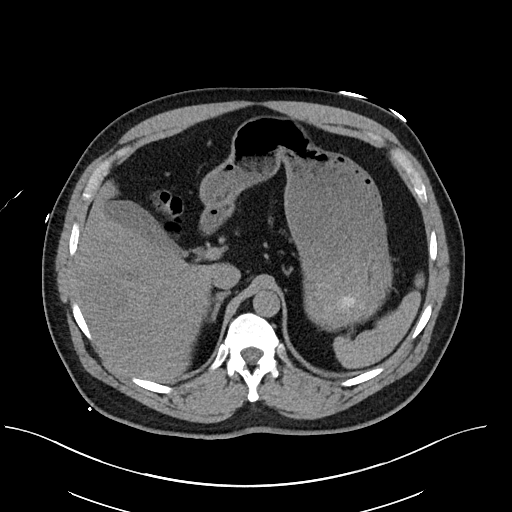
[im 91/107  soft-tissue]
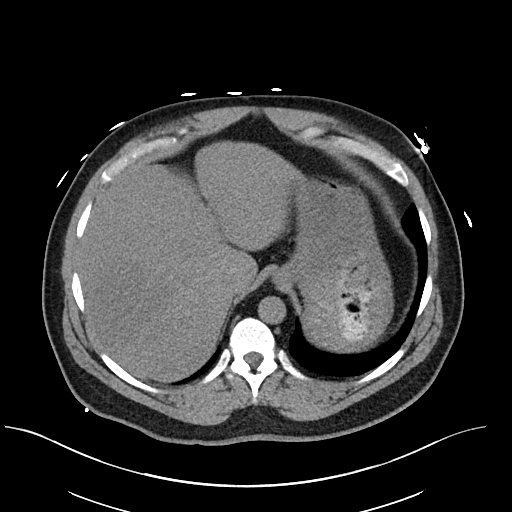
[im 101/107  soft-tissue]
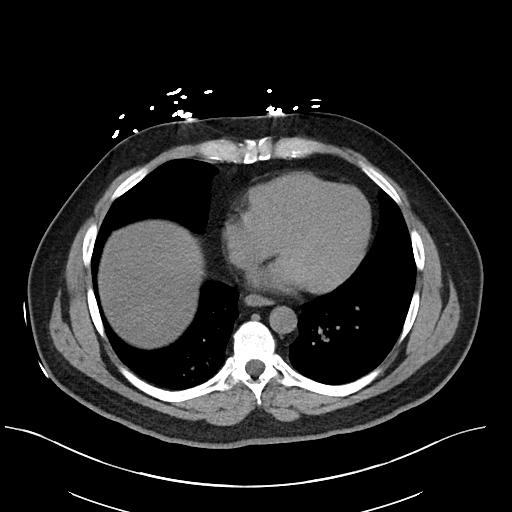

[Series 6: cor · coronal · 0.79mm/px · 3 of 107 slices shown]
[im 36/107  soft-tissue]
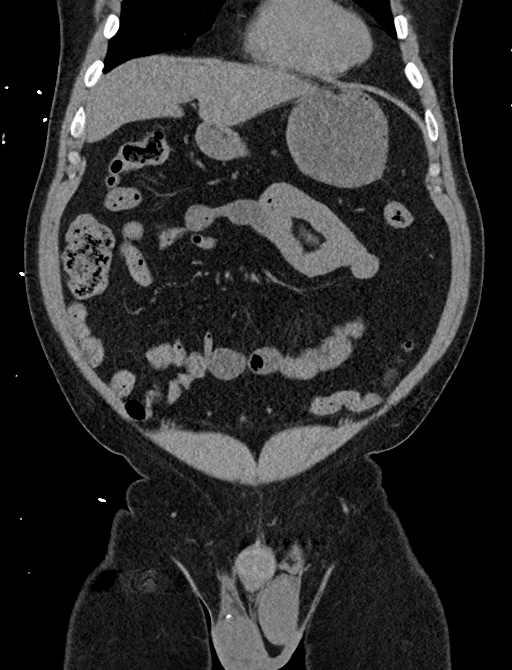
[im 48/107  soft-tissue]
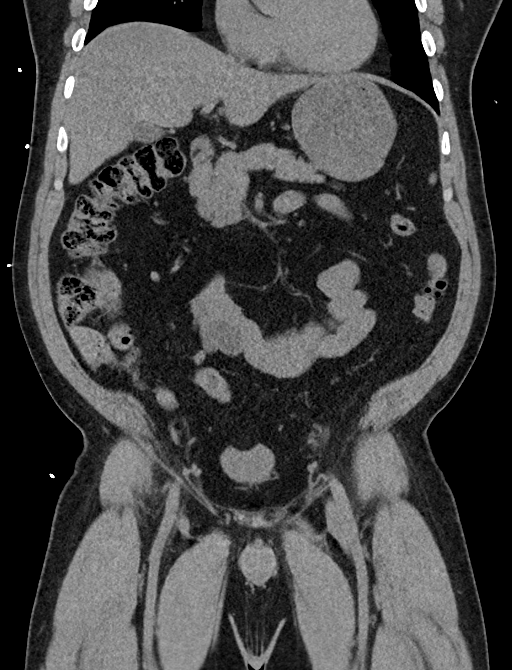
[im 59/107  soft-tissue]
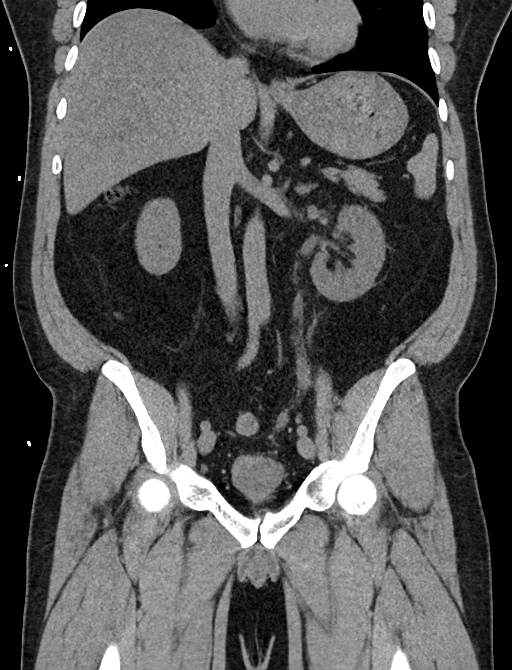

[16 of 46 positions shown; findings below may reference images not displayed]

FINDINGS: Lower chest: No acute abnormality.

Hepatobiliary: No focal liver abnormality is seen. No gallstones,
gallbladder wall thickening, or biliary dilatation.

Pancreas: Unremarkable. No pancreatic ductal dilatation or
surrounding inflammatory changes.

Spleen: Normal in size without focal abnormality.

Adrenals/Urinary Tract: Adrenal glands are unremarkable. Kidneys are
normal in size, without focal lesions. A 2 mm nonobstructing renal
calculus is seen within the lower pole of the left kidney. A 5 mm
obstructing ureteral calculus is seen at the left UVJ, with mild
left-sided hydronephrosis and hydroureter. Mild left-sided
perinephric inflammatory fat stranding is also seen. The urinary
bladder is poorly distended and subsequently limited in evaluation.

Stomach/Bowel: Stomach is within normal limits. Appendix appears
normal. No evidence of bowel wall thickening, distention, or
inflammatory changes.

Vascular/Lymphatic: No significant vascular findings are present. No
enlarged abdominal or pelvic lymph nodes.

Reproductive: Prostate is unremarkable.

Other: No abdominal wall hernia or abnormality. No abdominopelvic
ascites.

Musculoskeletal: No acute or significant osseous findings.
IMPRESSION: 1. 5 mm obstructing ureteral calculus at the left UVJ.
2. 2 mm nonobstructing left renal calculus.

## 2024-05-05 ENCOUNTER — Other Ambulatory Visit (HOSPITAL_COMMUNITY): Payer: Self-pay

## 2024-05-05 MED ORDER — WEGOVY 0.25 MG/0.5ML ~~LOC~~ SOAJ
0.5000 mL | SUBCUTANEOUS | 2 refills | Status: DC
  Filled 2024-05-05: qty 2, 28d supply, fill #0

## 2024-05-06 DIAGNOSIS — F4322 Adjustment disorder with anxiety: Secondary | ICD-10-CM | POA: Diagnosis not present

## 2024-05-07 ENCOUNTER — Other Ambulatory Visit: Payer: Self-pay | Admitting: Physician Assistant

## 2024-05-07 ENCOUNTER — Other Ambulatory Visit (HOSPITAL_COMMUNITY): Payer: Self-pay

## 2024-05-08 ENCOUNTER — Other Ambulatory Visit (HOSPITAL_COMMUNITY): Payer: Self-pay

## 2024-05-08 DIAGNOSIS — R7989 Other specified abnormal findings of blood chemistry: Secondary | ICD-10-CM | POA: Diagnosis not present

## 2024-05-08 DIAGNOSIS — R790 Abnormal level of blood mineral: Secondary | ICD-10-CM | POA: Diagnosis not present

## 2024-05-08 DIAGNOSIS — E291 Testicular hypofunction: Secondary | ICD-10-CM | POA: Diagnosis not present

## 2024-05-08 DIAGNOSIS — R5383 Other fatigue: Secondary | ICD-10-CM | POA: Diagnosis not present

## 2024-05-08 DIAGNOSIS — D519 Vitamin B12 deficiency anemia, unspecified: Secondary | ICD-10-CM | POA: Diagnosis not present

## 2024-05-08 DIAGNOSIS — E559 Vitamin D deficiency, unspecified: Secondary | ICD-10-CM | POA: Diagnosis not present

## 2024-05-08 DIAGNOSIS — R635 Abnormal weight gain: Secondary | ICD-10-CM | POA: Diagnosis not present

## 2024-05-08 DIAGNOSIS — F4322 Adjustment disorder with anxiety: Secondary | ICD-10-CM | POA: Diagnosis not present

## 2024-05-08 DIAGNOSIS — M2550 Pain in unspecified joint: Secondary | ICD-10-CM | POA: Diagnosis not present

## 2024-05-08 MED ORDER — LOSARTAN POTASSIUM 50 MG PO TABS
50.0000 mg | ORAL_TABLET | Freq: Every day | ORAL | 0 refills | Status: DC
  Filled 2024-05-08 – 2024-08-24 (×2): qty 90, 90d supply, fill #0

## 2024-05-14 ENCOUNTER — Other Ambulatory Visit (HOSPITAL_COMMUNITY): Payer: Self-pay

## 2024-05-14 DIAGNOSIS — F4322 Adjustment disorder with anxiety: Secondary | ICD-10-CM | POA: Diagnosis not present

## 2024-05-17 ENCOUNTER — Other Ambulatory Visit: Payer: Self-pay | Admitting: Physician Assistant

## 2024-05-17 ENCOUNTER — Other Ambulatory Visit (HOSPITAL_COMMUNITY): Payer: Self-pay

## 2024-05-17 DIAGNOSIS — G4700 Insomnia, unspecified: Secondary | ICD-10-CM

## 2024-05-18 ENCOUNTER — Other Ambulatory Visit (HOSPITAL_COMMUNITY): Payer: Self-pay

## 2024-05-18 MED ORDER — LISDEXAMFETAMINE DIMESYLATE 70 MG PO CAPS
70.0000 mg | ORAL_CAPSULE | Freq: Every day | ORAL | 0 refills | Status: DC
  Filled 2024-05-19: qty 30, 30d supply, fill #0

## 2024-05-18 MED ORDER — WEGOVY 0.5 MG/0.5ML ~~LOC~~ SOAJ
0.5000 mg | SUBCUTANEOUS | 2 refills | Status: DC
  Filled 2024-05-18: qty 2, 28d supply, fill #0

## 2024-05-18 MED ORDER — ZOLPIDEM TARTRATE 10 MG PO TABS
10.0000 mg | ORAL_TABLET | Freq: Every evening | ORAL | 0 refills | Status: DC | PRN
  Filled 2024-05-18 – 2024-05-19 (×2): qty 30, 30d supply, fill #0

## 2024-05-18 NOTE — Telephone Encounter (Signed)
 Last OV 02/20/24 Next OV 07/02/24  Last refill 04/20/24 Qty #30/0

## 2024-05-19 ENCOUNTER — Other Ambulatory Visit (HOSPITAL_COMMUNITY): Payer: Self-pay

## 2024-05-20 DIAGNOSIS — F4322 Adjustment disorder with anxiety: Secondary | ICD-10-CM | POA: Diagnosis not present

## 2024-05-22 DIAGNOSIS — F4322 Adjustment disorder with anxiety: Secondary | ICD-10-CM | POA: Diagnosis not present

## 2024-05-27 DIAGNOSIS — F4322 Adjustment disorder with anxiety: Secondary | ICD-10-CM | POA: Diagnosis not present

## 2024-05-28 ENCOUNTER — Other Ambulatory Visit (HOSPITAL_COMMUNITY): Payer: Self-pay

## 2024-05-28 MED ORDER — WEGOVY 1 MG/0.5ML ~~LOC~~ SOAJ
1.0000 mg | SUBCUTANEOUS | 2 refills | Status: DC
  Filled 2024-05-28: qty 2, 28d supply, fill #0
  Filled 2024-06-18: qty 2, 28d supply, fill #1
  Filled 2024-08-07: qty 2, 28d supply, fill #2

## 2024-05-29 DIAGNOSIS — F4322 Adjustment disorder with anxiety: Secondary | ICD-10-CM | POA: Diagnosis not present

## 2024-06-03 DIAGNOSIS — F4322 Adjustment disorder with anxiety: Secondary | ICD-10-CM | POA: Diagnosis not present

## 2024-06-05 DIAGNOSIS — F4322 Adjustment disorder with anxiety: Secondary | ICD-10-CM | POA: Diagnosis not present

## 2024-06-18 ENCOUNTER — Other Ambulatory Visit: Payer: Self-pay | Admitting: Physician Assistant

## 2024-06-18 ENCOUNTER — Other Ambulatory Visit (HOSPITAL_COMMUNITY): Payer: Self-pay

## 2024-06-18 DIAGNOSIS — G4700 Insomnia, unspecified: Secondary | ICD-10-CM

## 2024-06-19 ENCOUNTER — Other Ambulatory Visit: Payer: Self-pay

## 2024-06-19 ENCOUNTER — Other Ambulatory Visit (HOSPITAL_COMMUNITY): Payer: Self-pay

## 2024-06-19 MED ORDER — ZOLPIDEM TARTRATE 10 MG PO TABS
10.0000 mg | ORAL_TABLET | Freq: Every evening | ORAL | 0 refills | Status: DC | PRN
  Filled 2024-06-19: qty 30, 30d supply, fill #0

## 2024-06-19 MED ORDER — MONTELUKAST SODIUM 10 MG PO TABS
10.0000 mg | ORAL_TABLET | Freq: Every day | ORAL | 0 refills | Status: DC
  Filled 2024-06-19: qty 90, 90d supply, fill #0

## 2024-06-19 MED ORDER — LISDEXAMFETAMINE DIMESYLATE 70 MG PO CAPS
70.0000 mg | ORAL_CAPSULE | Freq: Every day | ORAL | 0 refills | Status: DC
  Filled 2024-06-19: qty 30, 30d supply, fill #0

## 2024-06-19 NOTE — Telephone Encounter (Signed)
 Last OV: 02/20/24  Next OV: 07/02/24  Last Filled: 05/18/24  Quantity: 30

## 2024-06-22 ENCOUNTER — Other Ambulatory Visit (HOSPITAL_COMMUNITY): Payer: Self-pay

## 2024-06-24 DIAGNOSIS — F4322 Adjustment disorder with anxiety: Secondary | ICD-10-CM | POA: Diagnosis not present

## 2024-06-26 DIAGNOSIS — F4322 Adjustment disorder with anxiety: Secondary | ICD-10-CM | POA: Diagnosis not present

## 2024-07-01 DIAGNOSIS — F4322 Adjustment disorder with anxiety: Secondary | ICD-10-CM | POA: Diagnosis not present

## 2024-07-02 ENCOUNTER — Encounter: Payer: Self-pay | Admitting: Physician Assistant

## 2024-07-02 ENCOUNTER — Ambulatory Visit (INDEPENDENT_AMBULATORY_CARE_PROVIDER_SITE_OTHER): Admitting: Physician Assistant

## 2024-07-02 VITALS — BP 120/82 | HR 85 | Temp 98.2°F | Ht 68.0 in | Wt 192.8 lb

## 2024-07-02 DIAGNOSIS — Z0000 Encounter for general adult medical examination without abnormal findings: Secondary | ICD-10-CM | POA: Diagnosis not present

## 2024-07-02 DIAGNOSIS — R7303 Prediabetes: Secondary | ICD-10-CM

## 2024-07-02 DIAGNOSIS — G4700 Insomnia, unspecified: Secondary | ICD-10-CM

## 2024-07-02 DIAGNOSIS — E782 Mixed hyperlipidemia: Secondary | ICD-10-CM

## 2024-07-02 DIAGNOSIS — Z125 Encounter for screening for malignant neoplasm of prostate: Secondary | ICD-10-CM

## 2024-07-02 NOTE — Progress Notes (Signed)
 Patient ID: Troy Burgess, male    DOB: [birth year], 48 y.o.   MRN: [id number]   Assessment & Plan:  Annual physical exam -     CBC with Differential/Platelet; Future -     Comprehensive metabolic panel with GFR; Future -     Hemoglobin A1c; Future -     Lipid panel; Future -     PSA; Future -     TSH; Future  Mixed hyperlipidemia  Prostate cancer screening -     PSA; Future  Prediabetes -     Hemoglobin A1c; Future  Insomnia, unspecified type     Assessment & Plan Adult Wellness Visit Routine adult wellness visit with recent weight loss of 25 pounds. - Perform physical examination - Order future lab work as a follow-up to recent weight loss  Obesity Obesity management with Wegovy , effective in weight loss without significant adverse effects. Previous use of Zepbound  was discontinued due to intolerable side effects. Discussed high cost of Wegovy  and potential insurance issues. - Continue Wegovy  for weight management - Discuss potential cost-saving options for Wegovy   Prediabetes Recent lab work indicated prediabetes. Weight loss and medication (Wegovy ) are expected to improve glycemic control. - Encourage continued weight loss and healthy lifestyle Lab Results  Component Value Date   HGBA1C 5.5 07/01/2023   HGBA1C 5.6 12/17/2022   HGBA1C 5.5 10/25/2021     Elevated liver enzymes Previous lab work showed elevated liver enzymes, now normalized. Weight loss and reduced alcohol intake are expected to benefit liver health. - Monitor liver function tests in future lab work  Insomnia Chronic insomnia with difficulty falling and staying asleep. Currently managed with Ambien , but he desires to discontinue due to poor sleep quality. - Continue Ambien  for insomnia management  General Health Maintenance Discussed immunizations and preventative health measures. Declined flu shot today but advised to get it by November if traveling. Discussed shingles vaccine and  dermatology follow-up. - Consider flu shot by November if traveling - Plan for shingles vaccine in the next few years - Consider dermatology follow-up if needed   Age-appropriate screening and counseling performed today. Will check labs and call with results. Preventive measures discussed and printed in AVS for patient.   Patient Counseling: [x]   Nutrition: Stressed importance of moderation in sodium/caffeine intake, saturated fat and cholesterol, caloric balance, sufficient intake of fresh fruits, vegetables, and fiber.  [x]   Stressed the importance of regular exercise.   [x]   Substance Abuse: Discussed cessation/primary prevention of tobacco, alcohol, or other drug use; driving or other dangerous activities under the influence; availability of treatment for abuse.   []   Injury prevention: Discussed safety belts, safety helmets, smoke detector, smoking near bedding or upholstery.   []   Sexuality: Discussed sexually transmitted diseases, partner selection, use of condoms, avoidance of unintended pregnancy  and contraceptive alternatives.   [x]   Dental health: Discussed importance of regular tooth brushing, flossing, and dental visits.  [x]   Health maintenance and immunizations reviewed. Please refer to Health maintenance section.       Return in about 6 months (around 12/31/2024) for recheck/follow-up.    Subjective:    Chief Complaint  Patient presents with   Annual Exam    Physical;     HPI Discussed the use of AI scribe software for clinical note transcription with the patient, who gave verbal consent to proceed.  History of Present Illness Troy Burgess is a 48 year old male who presents for a physical exam and follow-up on  weight loss and prediabetes.  He has experienced significant weight loss, losing 25 pounds recently after discontinuing Zepbound  due to sleep disturbances and starting Wegovy , which he tolerates better. He began with a 0.5 mg dose and progressed to a full  dose. Wegovy  costs $500 per month and is not covered by his insurance.  He underwent extensive lab work and was found to be prediabetic, a change from his usual borderline status. His red blood cell count was elevated, and he subsequently donated blood after being advised to do so. He suspects dehydration as a contributing factor, as he received IV fluids without needing to urinate, indicating significant dehydration. He reports elevated liver enzymes in recent lab work. He has significantly reduced alcohol consumption, noting that Wegovy  has altered the taste of wine, leading to only occasional consumption.  He has been experiencing intermittent difficulty breathing, feeling as though he is not getting enough oxygen at times. Despite this, he was able to walk extensively during a recent trip to [place], covering 70 miles in 10 days.  He is currently taking Ambien  for sleep issues, as he struggles to fall asleep and stay asleep, often lying awake for hours before falling asleep and waking frequently during the night.      Past Medical History:  Diagnosis Date   Elevated cholesterol    High blood pressure    Impacted cerumen 02/07/2016   CIR: 02/07/16 (Left)      Past Surgical History:  Procedure Laterality Date   COLONOSCOPY     COLONOSCOPY  11/08/2020   NO PAST SURGERIES     UPPER GASTROINTESTINAL ENDOSCOPY     UPPER GASTROINTESTINAL ENDOSCOPY  11/08/2020    Family History  Problem Relation Age of Onset   Hypertension Mother    Hypertension Father    Colon cancer Neg Hx    Esophageal cancer Neg Hx    Pancreatic cancer Neg Hx    Stomach cancer Neg Hx    Colon polyps Neg Hx    Rectal cancer Neg Hx     Social History   Tobacco Use   Smoking status: Former    Types: Cigarettes   Smokeless tobacco: Never  Vaping Use   Vaping status: Never Used  Substance Use Topics   Alcohol use: Yes    Alcohol/week: 2.0 standard drinks of alcohol    Types: 2 Glasses of wine  per week    Comment: 2 glasses of wine each day   Drug use: Never     Allergies  Allergen Reactions   Amlodipine Swelling    Pedal edema   Bupropion Other (See Comments)    Other reaction(s): Malaise (intolerance)   Lisinopril Cough    Other reaction(s): Cough (ALLERGY/intolerance)   Ciprofloxacin Other (See Comments)    Other reaction(s): Myalgias (intolerance)   Codeine Nausea And Vomiting and Nausea Only   Hydrocodone-Acetaminophen  Nausea And Vomiting and Nausea Only   Nortriptyline Other (See Comments)    Other reaction(s): Other (See Comments)  unknown  Made me feel weird    Review of Systems NEGATIVE UNLESS OTHERWISE INDICATED IN HPI      Objective:     BP 120/82   Pulse 85   Temp 98.2 F (36.8 C)   Ht 5' 8 (1.727 m)   Wt 192 lb 12.8 oz (87.5 kg)   SpO2 96%   BMI 29.32 kg/m   Wt Readings from Last 3 Encounters:  07/02/24 192 lb 12.8 oz (87.5 kg)  02/20/24 208  lb (94.3 kg)  01/27/24 208 lb 12.8 oz (94.7 kg)    BP Readings from Last 3 Encounters:  07/02/24 120/82  01/27/24 138/88  07/01/23 120/82     Physical Exam Vitals and nursing note reviewed.  Constitutional:      General: He is not in acute distress.    Appearance: Normal appearance. He is obese. He is not toxic-appearing.  HENT:     Head: Normocephalic and atraumatic.     Right Ear: Tympanic membrane, ear canal and external ear normal.     Left Ear: Tympanic membrane, ear canal and external ear normal.     Nose: Nose normal.     Mouth/Throat:     Mouth: Mucous membranes are moist.     Pharynx: Oropharynx is clear.  Eyes:     Extraocular Movements: Extraocular movements intact.     Conjunctiva/sclera: Conjunctivae normal.     Pupils: Pupils are equal, round, and reactive to light.  Cardiovascular:     Rate and Rhythm: Normal rate and regular rhythm.     Pulses: Normal pulses.     Heart sounds: Normal heart sounds.  Pulmonary:     Effort: Pulmonary effort is normal.     Breath  sounds: Normal breath sounds.  Abdominal:     General: Abdomen is flat. Bowel sounds are normal.     Palpations: Abdomen is soft.     Tenderness: There is no abdominal tenderness.  Musculoskeletal:        General: Normal range of motion.     Cervical back: Normal range of motion and neck supple.  Skin:    General: Skin is warm and dry.     Findings: No lesion or rash.  Neurological:     General: No focal deficit present.     Mental Status: He is alert and oriented to person, place, and time.  Psychiatric:        Mood and Affect: Mood normal.        Behavior: Behavior normal.             Marcina Kinnison M Sneijder Bernards, PA-C

## 2024-07-02 NOTE — Patient Instructions (Signed)
 Lab visit - fasting in 8 weeks Next f/up with Coyle Stordahl in 6 months

## 2024-07-03 DIAGNOSIS — F4322 Adjustment disorder with anxiety: Secondary | ICD-10-CM | POA: Diagnosis not present

## 2024-07-06 DIAGNOSIS — F902 Attention-deficit hyperactivity disorder, combined type: Secondary | ICD-10-CM | POA: Diagnosis not present

## 2024-07-06 DIAGNOSIS — F419 Anxiety disorder, unspecified: Secondary | ICD-10-CM | POA: Diagnosis not present

## 2024-07-06 DIAGNOSIS — Z79899 Other long term (current) drug therapy: Secondary | ICD-10-CM | POA: Diagnosis not present

## 2024-07-08 DIAGNOSIS — F4322 Adjustment disorder with anxiety: Secondary | ICD-10-CM | POA: Diagnosis not present

## 2024-07-10 DIAGNOSIS — F4322 Adjustment disorder with anxiety: Secondary | ICD-10-CM | POA: Diagnosis not present

## 2024-07-14 ENCOUNTER — Other Ambulatory Visit (HOSPITAL_COMMUNITY): Payer: Self-pay

## 2024-07-14 MED ORDER — AMOXICILLIN 500 MG PO CAPS
ORAL_CAPSULE | ORAL | 0 refills | Status: AC
  Filled 2024-07-14: qty 30, 10d supply, fill #0

## 2024-07-15 DIAGNOSIS — F4322 Adjustment disorder with anxiety: Secondary | ICD-10-CM | POA: Diagnosis not present

## 2024-07-17 DIAGNOSIS — F4322 Adjustment disorder with anxiety: Secondary | ICD-10-CM | POA: Diagnosis not present

## 2024-07-22 DIAGNOSIS — F4322 Adjustment disorder with anxiety: Secondary | ICD-10-CM | POA: Diagnosis not present

## 2024-07-23 ENCOUNTER — Other Ambulatory Visit: Payer: Self-pay | Admitting: Physician Assistant

## 2024-07-23 ENCOUNTER — Other Ambulatory Visit (HOSPITAL_COMMUNITY): Payer: Self-pay

## 2024-07-23 ENCOUNTER — Telehealth: Payer: Self-pay

## 2024-07-23 ENCOUNTER — Encounter (HOSPITAL_COMMUNITY): Payer: Self-pay

## 2024-07-23 DIAGNOSIS — G4700 Insomnia, unspecified: Secondary | ICD-10-CM

## 2024-07-23 MED ORDER — ZOLPIDEM TARTRATE 10 MG PO TABS
10.0000 mg | ORAL_TABLET | Freq: Every evening | ORAL | 0 refills | Status: DC | PRN
Start: 2024-07-23 — End: 2024-08-24
  Filled 2024-07-23: qty 30, 30d supply, fill #0

## 2024-07-23 NOTE — Telephone Encounter (Unsigned)
 Copied from CRM #[id number]. Topic: Clinical - Medication Refill >> Jul 23, 2024 12:18 PM Rea ORN wrote: Medication:  zolpidem  (AMBIEN ) 10 MG tablet *PT NEEDS TODAY. [place].  Has the patient contacted their pharmacy? Yes (Agent: If no, request that the patient contact the pharmacy for the refill. If patient does not wish to contact the pharmacy document the reason why and proceed with request.) (Agent: If yes, when and what did the pharmacy advise?)  This is the patient's preferred pharmacy:  [place] [facility] [address] [place] [address] Phone: [phone] Fax: [phone]  Is this the correct pharmacy for this prescription? Yes If no, delete pharmacy and type the correct one.   Has the prescription been filled recently? No  Is the patient out of the medication? Yes  Has the patient been seen for an appointment in the last year OR does the patient have an upcoming appointment? Yes  Can we respond through MyChart? Yes  Agent: Please be advised that Rx refills may take up to 3 business days. We ask that you follow-up with your pharmacy.

## 2024-07-23 NOTE — Telephone Encounter (Signed)
 Copied from CRM #[id number]. Topic: Clinical - Medication Refill >> Jul 23, 2024 12:18 PM Rea ORN wrote: Medication:  zolpidem  (AMBIEN ) 10 MG tablet *PT NEEDS TODAY. [place].  Has the patient contacted their pharmacy? Yes (Agent: If no, request that the patient contact the pharmacy for the refill. If patient does not wish to contact the pharmacy document the reason why and proceed with request.) (Agent: If yes, when and what did the pharmacy advise?)  This is the patient's preferred pharmacy:  [place] - [facility] [address], [address] [place] [address] Phone: [phone] Fax: [phone]  Is this the correct pharmacy for this prescription? Yes If no, delete pharmacy and type the correct one.   Has the prescription been filled recently? No  Is the patient out of the medication? Yes  Has the patient been seen for an appointment in the last year OR does the patient have an upcoming appointment? Yes  Can we respond through MyChart? Yes  Agent: Please be advised that Rx refills may take up to 3 business days. We ask that you follow-up with your pharmacy. >> Jul 23, 2024  2:19 PM Rosina BIRCH wrote: Patient called stating the provider denied his medication refill for ambien  and he received a message through mychart stating it [phone]   Please see pt request, Rx refill sent to inbasket previously today for approval

## 2024-07-23 NOTE — Telephone Encounter (Signed)
 Patient requesting refill for today as he his leaving town tomorrow

## 2024-07-23 NOTE — Telephone Encounter (Signed)
 Last OV: 07/02/24  Next OV: 01/01/25  Last filled: 06/19/24  Quantity: 30

## 2024-08-07 ENCOUNTER — Encounter: Payer: Self-pay | Admitting: Physician Assistant

## 2024-08-07 ENCOUNTER — Other Ambulatory Visit: Payer: Self-pay | Admitting: Physician Assistant

## 2024-08-07 ENCOUNTER — Other Ambulatory Visit (HOSPITAL_COMMUNITY): Payer: Self-pay

## 2024-08-07 ENCOUNTER — Encounter (HOSPITAL_COMMUNITY): Payer: Self-pay

## 2024-08-07 DIAGNOSIS — G4700 Insomnia, unspecified: Secondary | ICD-10-CM

## 2024-08-10 ENCOUNTER — Other Ambulatory Visit (HOSPITAL_COMMUNITY): Payer: Self-pay

## 2024-08-10 MED ORDER — LISDEXAMFETAMINE DIMESYLATE 70 MG PO CAPS
70.0000 mg | ORAL_CAPSULE | Freq: Every day | ORAL | 0 refills | Status: DC
  Filled 2024-08-10: qty 30, 30d supply, fill #0

## 2024-08-12 DIAGNOSIS — F4322 Adjustment disorder with anxiety: Secondary | ICD-10-CM | POA: Diagnosis not present

## 2024-08-19 DIAGNOSIS — F4322 Adjustment disorder with anxiety: Secondary | ICD-10-CM | POA: Diagnosis not present

## 2024-08-24 ENCOUNTER — Other Ambulatory Visit (HOSPITAL_COMMUNITY): Payer: Self-pay

## 2024-08-24 ENCOUNTER — Other Ambulatory Visit: Payer: Self-pay | Admitting: Physician Assistant

## 2024-08-24 ENCOUNTER — Other Ambulatory Visit: Payer: Self-pay

## 2024-08-24 DIAGNOSIS — G4700 Insomnia, unspecified: Secondary | ICD-10-CM

## 2024-08-24 MED ORDER — ZOLPIDEM TARTRATE 10 MG PO TABS
10.0000 mg | ORAL_TABLET | Freq: Every evening | ORAL | 0 refills | Status: DC | PRN
  Filled 2024-08-24: qty 30, 30d supply, fill #0

## 2024-08-24 MED ORDER — WEGOVY 1 MG/0.5ML ~~LOC~~ SOAJ
1.0000 mg | SUBCUTANEOUS | 2 refills | Status: AC
  Filled 2024-08-24 – 2024-09-13 (×3): qty 2, 28d supply, fill #0
  Filled 2024-10-15: qty 2, 28d supply, fill #1

## 2024-08-24 NOTE — Telephone Encounter (Signed)
 Last OV: 07/02/24   Next OV: 01/01/25  Last filled: 07/23/24  Quantity: 30

## 2024-08-25 ENCOUNTER — Other Ambulatory Visit (HOSPITAL_COMMUNITY): Payer: Self-pay

## 2024-08-25 DIAGNOSIS — F4322 Adjustment disorder with anxiety: Secondary | ICD-10-CM | POA: Diagnosis not present

## 2024-08-28 ENCOUNTER — Other Ambulatory Visit (HOSPITAL_COMMUNITY): Payer: Self-pay

## 2024-08-28 ENCOUNTER — Other Ambulatory Visit: Payer: Self-pay

## 2024-09-02 DIAGNOSIS — F4322 Adjustment disorder with anxiety: Secondary | ICD-10-CM | POA: Diagnosis not present

## 2024-09-08 ENCOUNTER — Other Ambulatory Visit (HOSPITAL_COMMUNITY): Payer: Self-pay

## 2024-09-13 ENCOUNTER — Other Ambulatory Visit (HOSPITAL_COMMUNITY): Payer: Self-pay

## 2024-09-14 ENCOUNTER — Other Ambulatory Visit (HOSPITAL_COMMUNITY): Payer: Self-pay

## 2024-09-14 MED ORDER — LISDEXAMFETAMINE DIMESYLATE 70 MG PO CAPS
70.0000 mg | ORAL_CAPSULE | Freq: Every day | ORAL | 0 refills | Status: DC
  Filled 2024-09-14: qty 30, 30d supply, fill #0

## 2024-09-16 DIAGNOSIS — F4322 Adjustment disorder with anxiety: Secondary | ICD-10-CM | POA: Diagnosis not present

## 2024-09-18 DIAGNOSIS — F4322 Adjustment disorder with anxiety: Secondary | ICD-10-CM | POA: Diagnosis not present

## 2024-09-21 ENCOUNTER — Other Ambulatory Visit: Payer: Self-pay | Admitting: Physician Assistant

## 2024-09-21 ENCOUNTER — Other Ambulatory Visit (HOSPITAL_COMMUNITY): Payer: Self-pay

## 2024-09-21 DIAGNOSIS — G4700 Insomnia, unspecified: Secondary | ICD-10-CM

## 2024-09-21 MED ORDER — ZOLPIDEM TARTRATE 10 MG PO TABS
10.0000 mg | ORAL_TABLET | Freq: Every evening | ORAL | 0 refills | Status: AC | PRN
  Filled 2024-10-27: qty 30, 30d supply, fill #0

## 2024-09-21 MED ORDER — ZOLPIDEM TARTRATE 10 MG PO TABS
10.0000 mg | ORAL_TABLET | Freq: Every evening | ORAL | 0 refills | Status: DC | PRN
  Filled 2024-09-21: qty 30, 30d supply, fill #0

## 2024-09-21 NOTE — Telephone Encounter (Unsigned)
 Copied from CRM #[id number]. Topic: Clinical - Medication Refill >> Sep 21, 2024 10:41 AM Mesmerise C wrote: Medication:  zolpidem  (AMBIEN ) 10 MG tablet  Has the patient contacted their pharmacy? Yes (Agent: If no, request that the patient contact the pharmacy for the refill. If patient does not wish to contact the pharmacy document the reason why and proceed with request.) (Agent: If yes, when and what did the pharmacy advise?)  This is the patient's preferred pharmacy:  [place] - [facility] [address] [place] [address] Phone: [phone] Fax: [phone]  Is this the correct pharmacy for this prescription? Yes If no, delete pharmacy and type the correct one.   Has the prescription been filled recently? No  Is the patient out of the medication? No  Has the patient been seen for an appointment in the last year OR does the patient have an upcoming appointment? Yes  Can we respond through MyChart? Yes  Agent: Please be advised that Rx refills may take up to 3 business days. We ask that you follow-up with your pharmacy.  Patient is leaving for [place]  and needs it refilled early would like it to be sent today before he leaves, has to have on the prescription he can pick it up a day early due to leaving

## 2024-09-21 NOTE — Telephone Encounter (Signed)
 Last OV: 07/02/24  Next OV: 01/01/25  Last filled: 08/24/24  Quantity: 30

## 2024-10-15 ENCOUNTER — Other Ambulatory Visit: Payer: Self-pay

## 2024-10-15 ENCOUNTER — Other Ambulatory Visit (HOSPITAL_COMMUNITY): Payer: Self-pay

## 2024-10-15 ENCOUNTER — Other Ambulatory Visit: Payer: Self-pay | Admitting: Physician Assistant

## 2024-10-15 MED ORDER — LOSARTAN POTASSIUM 50 MG PO TABS
50.0000 mg | ORAL_TABLET | Freq: Every day | ORAL | 0 refills | Status: AC
  Filled 2024-10-15: qty 90, 90d supply, fill #0

## 2024-10-15 MED ORDER — ATORVASTATIN CALCIUM 40 MG PO TABS
40.0000 mg | ORAL_TABLET | Freq: Every day | ORAL | 1 refills | Status: AC
  Filled 2024-10-15: qty 90, 90d supply, fill #0

## 2024-10-15 MED ORDER — LISDEXAMFETAMINE DIMESYLATE 70 MG PO CAPS
70.0000 mg | ORAL_CAPSULE | Freq: Every day | ORAL | 0 refills | Status: AC
  Filled 2024-10-15: qty 30, 30d supply, fill #0

## 2024-10-21 ENCOUNTER — Other Ambulatory Visit (HOSPITAL_COMMUNITY): Payer: Self-pay

## 2024-10-21 MED ORDER — HYDROCODONE-ACETAMINOPHEN 5-325 MG PO TABS
1.0000 | ORAL_TABLET | ORAL | 0 refills | Status: AC | PRN
  Filled 2024-10-21: qty 12, 2d supply, fill #0

## 2024-10-27 ENCOUNTER — Other Ambulatory Visit: Payer: Self-pay | Admitting: Physician Assistant

## 2024-10-27 ENCOUNTER — Other Ambulatory Visit (HOSPITAL_COMMUNITY): Payer: Self-pay

## 2024-10-27 ENCOUNTER — Encounter (HOSPITAL_COMMUNITY): Payer: Self-pay

## 2024-10-27 MED ORDER — MONTELUKAST SODIUM 10 MG PO TABS
10.0000 mg | ORAL_TABLET | Freq: Every day | ORAL | 0 refills | Status: AC
  Filled 2024-10-27: qty 90, 90d supply, fill #0

## 2024-11-06 ENCOUNTER — Other Ambulatory Visit: Payer: Self-pay | Admitting: Physician Assistant

## 2025-01-01 ENCOUNTER — Ambulatory Visit: Admitting: Physician Assistant
